# Patient Record
Sex: Male | Born: 1970 | Race: White | Hispanic: No | Marital: Married | Smoking: Current every day smoker
Health system: Southern US, Community
[De-identification: ages and names within clinical notes are randomized; demographics above are authoritative.]

## PROBLEM LIST (undated history)

## (undated) DIAGNOSIS — F32A Depression, unspecified: Secondary | ICD-10-CM

## (undated) DIAGNOSIS — K5792 Diverticulitis of intestine, part unspecified, without perforation or abscess without bleeding: Secondary | ICD-10-CM

## (undated) DIAGNOSIS — G43909 Migraine, unspecified, not intractable, without status migrainosus: Secondary | ICD-10-CM

## (undated) DIAGNOSIS — F329 Major depressive disorder, single episode, unspecified: Secondary | ICD-10-CM

## (undated) HISTORY — PX: NO PAST SURGERIES: SHX2092

---

## 2003-09-25 ENCOUNTER — Emergency Department (HOSPITAL_COMMUNITY): Admission: EM | Admit: 2003-09-25 | Discharge: 2003-09-25 | Payer: Self-pay | Admitting: Emergency Medicine

## 2004-07-12 ENCOUNTER — Emergency Department (HOSPITAL_COMMUNITY): Admission: EM | Admit: 2004-07-12 | Discharge: 2004-07-12 | Payer: Self-pay | Admitting: Emergency Medicine

## 2004-07-12 ENCOUNTER — Emergency Department (HOSPITAL_COMMUNITY): Admission: EM | Admit: 2004-07-12 | Discharge: 2004-07-12 | Payer: Self-pay | Admitting: Family Medicine

## 2004-10-24 ENCOUNTER — Emergency Department (HOSPITAL_COMMUNITY): Admission: EM | Admit: 2004-10-24 | Discharge: 2004-10-24 | Payer: Self-pay | Admitting: Emergency Medicine

## 2005-01-09 ENCOUNTER — Emergency Department (HOSPITAL_COMMUNITY): Admission: EM | Admit: 2005-01-09 | Discharge: 2005-01-09 | Payer: Self-pay | Admitting: Family Medicine

## 2005-01-09 ENCOUNTER — Emergency Department (HOSPITAL_COMMUNITY): Admission: EM | Admit: 2005-01-09 | Discharge: 2005-01-09 | Payer: Self-pay | Admitting: Emergency Medicine

## 2007-10-11 ENCOUNTER — Encounter: Admission: RE | Admit: 2007-10-11 | Discharge: 2007-10-11 | Payer: Self-pay | Admitting: Chiropractic Medicine

## 2009-08-24 ENCOUNTER — Ambulatory Visit: Payer: Self-pay | Admitting: Diagnostic Radiology

## 2009-08-24 ENCOUNTER — Emergency Department (HOSPITAL_BASED_OUTPATIENT_CLINIC_OR_DEPARTMENT_OTHER): Admission: EM | Admit: 2009-08-24 | Discharge: 2009-08-24 | Payer: Self-pay | Admitting: Emergency Medicine

## 2009-09-08 ENCOUNTER — Ambulatory Visit (HOSPITAL_COMMUNITY): Admission: RE | Admit: 2009-09-08 | Discharge: 2009-09-08 | Payer: Self-pay | Admitting: Plastic Surgery

## 2009-09-30 ENCOUNTER — Emergency Department (HOSPITAL_COMMUNITY): Admission: EM | Admit: 2009-09-30 | Discharge: 2009-09-30 | Payer: Self-pay | Admitting: Emergency Medicine

## 2009-10-06 ENCOUNTER — Ambulatory Visit (HOSPITAL_COMMUNITY): Admission: RE | Admit: 2009-10-06 | Discharge: 2009-10-06 | Payer: Self-pay | Admitting: Plastic Surgery

## 2009-11-05 ENCOUNTER — Ambulatory Visit (HOSPITAL_COMMUNITY): Admission: RE | Admit: 2009-11-05 | Discharge: 2009-11-05 | Payer: Self-pay | Admitting: Plastic Surgery

## 2011-01-20 ENCOUNTER — Inpatient Hospital Stay (INDEPENDENT_AMBULATORY_CARE_PROVIDER_SITE_OTHER)
Admission: RE | Admit: 2011-01-20 | Discharge: 2011-01-20 | Disposition: A | Discharge status: Home / Self Care | Payer: Self-pay | Source: Ambulatory Visit | Attending: Family Medicine | Admitting: Family Medicine

## 2011-01-20 DIAGNOSIS — K047 Periapical abscess without sinus: Secondary | ICD-10-CM

## 2011-07-23 ENCOUNTER — Encounter: Payer: Self-pay | Admitting: *Deleted

## 2011-07-23 ENCOUNTER — Emergency Department (HOSPITAL_COMMUNITY): Payer: Self-pay

## 2011-07-23 ENCOUNTER — Emergency Department (HOSPITAL_COMMUNITY)
Admission: EM | Admit: 2011-07-23 | Discharge: 2011-07-23 | Disposition: A | Discharge status: Home / Self Care | Payer: Self-pay | Attending: Emergency Medicine | Admitting: Emergency Medicine

## 2011-07-23 DIAGNOSIS — S6990XA Unspecified injury of unspecified wrist, hand and finger(s), initial encounter: Secondary | ICD-10-CM | POA: Insufficient documentation

## 2011-07-23 DIAGNOSIS — S66911A Strain of unspecified muscle, fascia and tendon at wrist and hand level, right hand, initial encounter: Secondary | ICD-10-CM

## 2011-07-23 DIAGNOSIS — M79609 Pain in unspecified limb: Secondary | ICD-10-CM | POA: Insufficient documentation

## 2011-07-23 DIAGNOSIS — Y9383 Activity, rough housing and horseplay: Secondary | ICD-10-CM | POA: Insufficient documentation

## 2011-07-23 DIAGNOSIS — X58XXXA Exposure to other specified factors, initial encounter: Secondary | ICD-10-CM | POA: Insufficient documentation

## 2011-07-23 DIAGNOSIS — M7989 Other specified soft tissue disorders: Secondary | ICD-10-CM | POA: Insufficient documentation

## 2011-07-23 DIAGNOSIS — S6390XA Sprain of unspecified part of unspecified wrist and hand, initial encounter: Secondary | ICD-10-CM | POA: Insufficient documentation

## 2011-07-23 MED ORDER — HYDROCODONE-ACETAMINOPHEN 5-325 MG PO TABS: 2.0000 | ORAL_TABLET | ORAL | Status: AC | PRN

## 2011-07-23 NOTE — Progress Notes (Signed)
Orthopedic Tech Progress Note Patient Details:  Darrell Hawkins 09-04-1970 [id number]  Splint Location: applied velcro wrist splint to right hand    [name] 07/23/2011, 12:55 PM

## 2011-07-23 NOTE — ED Provider Notes (Signed)
History     CSN: [id number]  Arrival date & time 07/23/11  1015   First MD Initiated Contact with Patient 07/23/11 1226      Chief Complaint  Patient presents with  . Hand Injury    (Consider location/radiation/quality/duration/timing/severity/associated sxs/prior treatment) HPI This 41 year old male was playing wrestling last night when he strained the dorsum of his right hand with mild swelling and tenderness since that time. His pain is moderately severe it is worse with palpation it is localized without radiation associated weakness or numbness or other concerns or symptoms. His pain is to the dorsum of the right hand it does not involve the right wrist. He has no pain to the elbow or shoulder. There is no treatment prior to arrival. He has no other concerns at this time. History reviewed. No pertinent past medical history.  History reviewed. No pertinent past surgical history.  History reviewed. No pertinent family history.  History  Substance Use Topics  . Smoking status: Current Everyday Smoker -- 1.0 packs/day  . Smokeless tobacco: Not on file  . Alcohol Use: Yes     occ      Review of Systems  Constitutional: Negative for fever.       10 Systems reviewed and are negative for acute change except as noted in the HPI.  HENT: Negative for congestion.   Eyes: Negative for discharge and redness.  Respiratory: Negative for cough and shortness of breath.   Cardiovascular: Negative for chest pain.  Gastrointestinal: Negative for vomiting and abdominal pain.  Musculoskeletal: Negative for back pain.  Skin: Negative for rash.  Neurological: Negative for syncope, numbness and headaches.  Psychiatric/Behavioral:       No behavior change.    Allergies  Review of patient's allergies indicates no known allergies.  Home Medications   Current Outpatient Rx  Name Route Sig Dispense Refill  . HYDROCODONE-ACETAMINOPHEN 5-325 MG PO TABS Oral Take 2 tablets by mouth every 4  (four) hours as needed for pain. 20 tablet 0    BP 130/72  Pulse 82  Temp(Src) 97.8 F (36.6 C) (Oral)  Resp 18  SpO2 99%  Physical Exam  Nursing note and vitals reviewed. Constitutional:       Awake, alert, nontoxic appearance.  HENT:  Head: Atraumatic.  Eyes: Right eye exhibits no discharge. Left eye exhibits no discharge.  Neck: Neck supple.  Pulmonary/Chest: Effort normal. He exhibits no tenderness.  Abdominal: Soft. There is no tenderness. There is no rebound.  Musculoskeletal: He exhibits tenderness.       Baseline ROM, no obvious new focal weakness.  His left arm and both legs are nontender. His right arm is nontender at the shoulder elbow and wrist including no tenderness to the anatomic snuffbox or lunate area. His right hand has full range of motion but he has painful extension at his wrist and fingers due to the dorsal hand mild swelling and moderate tenderness. He has capillary refill less than 2 seconds and normal light touch was intact extension and flexion of all digits on his right hand without obvious focal weakness.  Neurological: He is alert.       Mental status and motor strength appears baseline for patient and situation.  Skin: No rash noted.  Psychiatric: He has a normal mood and affect.    ED Course  Procedures (including critical care time)  Labs Reviewed - No data to display Dg Hand Complete Right  07/23/2011  *RADIOLOGY REPORT*  Clinical Data: Right hand  and wrist pain and swelling following a fall last night.  RIGHT HAND - COMPLETE 3+ VIEW  Comparison: None.  Findings: Normal appearing bones and soft tissues without fracture or dislocation.  IMPRESSION: Normal examination.  Original Report Authenticated By: Darrol Angel, M.D.     1. Strain of hand, right       MDM  Patient informed of clinical course, understand medical decision-making process, and agree with plan.        Hurman Horn, MD 07/23/11 [phone]

## 2011-07-23 NOTE — ED Notes (Signed)
Pt reports wrestling last night, landed on right hand and now having pain and swelling.

## 2012-05-21 ENCOUNTER — Encounter (HOSPITAL_COMMUNITY): Payer: Self-pay | Admitting: *Deleted

## 2012-05-21 ENCOUNTER — Emergency Department (HOSPITAL_COMMUNITY)
Admission: EM | Admit: 2012-05-21 | Discharge: 2012-05-21 | Disposition: A | Discharge status: Home / Self Care | Payer: Self-pay | Attending: Emergency Medicine | Admitting: Emergency Medicine

## 2012-05-21 ENCOUNTER — Emergency Department (HOSPITAL_COMMUNITY): Payer: Self-pay

## 2012-05-21 DIAGNOSIS — F17200 Nicotine dependence, unspecified, uncomplicated: Secondary | ICD-10-CM | POA: Insufficient documentation

## 2012-05-21 DIAGNOSIS — Y9389 Activity, other specified: Secondary | ICD-10-CM | POA: Insufficient documentation

## 2012-05-21 DIAGNOSIS — Y92009 Unspecified place in unspecified non-institutional (private) residence as the place of occurrence of the external cause: Secondary | ICD-10-CM | POA: Insufficient documentation

## 2012-05-21 DIAGNOSIS — S90129A Contusion of unspecified lesser toe(s) without damage to nail, initial encounter: Secondary | ICD-10-CM | POA: Insufficient documentation

## 2012-05-21 DIAGNOSIS — X58XXXA Exposure to other specified factors, initial encounter: Secondary | ICD-10-CM | POA: Insufficient documentation

## 2012-05-21 MED ORDER — HYDROCODONE-ACETAMINOPHEN 5-325 MG PO TABS
2.0000 | ORAL_TABLET | Freq: Once | ORAL | Status: AC
  Administered 2012-05-21: 2 via ORAL
  Filled 2012-05-21: qty 2

## 2012-05-21 MED ORDER — HYDROCODONE-ACETAMINOPHEN 5-500 MG PO TABS: 1.0000 | ORAL_TABLET | Freq: Four times a day (QID) | ORAL | Status: DC | PRN
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
## 2014-01-23 ENCOUNTER — Emergency Department (HOSPITAL_COMMUNITY)
Admission: EM | Admit: 2014-01-23 | Discharge: 2014-01-23 | Disposition: A | Discharge status: Home / Self Care | Payer: Self-pay | Attending: Emergency Medicine | Admitting: Emergency Medicine

## 2014-01-23 ENCOUNTER — Encounter (HOSPITAL_COMMUNITY): Payer: Self-pay | Admitting: Emergency Medicine

## 2014-01-23 DIAGNOSIS — F17200 Nicotine dependence, unspecified, uncomplicated: Secondary | ICD-10-CM | POA: Insufficient documentation

## 2014-01-23 DIAGNOSIS — Y9289 Other specified places as the place of occurrence of the external cause: Secondary | ICD-10-CM | POA: Insufficient documentation

## 2014-01-23 DIAGNOSIS — H16133 Photokeratitis, bilateral: Secondary | ICD-10-CM

## 2014-01-23 DIAGNOSIS — H16139 Photokeratitis, unspecified eye: Secondary | ICD-10-CM | POA: Insufficient documentation

## 2014-01-23 DIAGNOSIS — Z79899 Other long term (current) drug therapy: Secondary | ICD-10-CM | POA: Insufficient documentation

## 2014-01-23 DIAGNOSIS — Y9389 Activity, other specified: Secondary | ICD-10-CM | POA: Insufficient documentation

## 2014-01-23 DIAGNOSIS — W899XXA Exposure to unspecified man-made visible and ultraviolet light, initial encounter: Secondary | ICD-10-CM | POA: Insufficient documentation

## 2014-01-23 MED ORDER — FLUORESCEIN SODIUM 1 MG OP STRP
1.0000 | ORAL_STRIP | Freq: Once | OPHTHALMIC | Status: AC
  Administered 2014-01-23: 1 via OPHTHALMIC
  Filled 2014-01-23 (×2): qty 1

## 2014-01-23 MED ORDER — OXYCODONE-ACETAMINOPHEN 5-325 MG PO TABS: 1.0000 | ORAL_TABLET | Freq: Four times a day (QID) | ORAL | Status: DC | PRN

## 2014-01-23 MED ORDER — TETRACAINE HCL 0.5 % OP SOLN
2.0000 [drp] | Freq: Once | OPHTHALMIC | Status: AC
  Administered 2014-01-23: 2 [drp] via OPHTHALMIC
  Filled 2014-01-23: qty 2

## 2014-01-23 MED ORDER — ERYTHROMYCIN 5 MG/GM OP OINT: 1.0000 "application " | TOPICAL_OINTMENT | Freq: Four times a day (QID) | OPHTHALMIC | Status: DC
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
## 2014-01-23 NOTE — ED Notes (Signed)
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
## 2014-01-25 NOTE — ED Provider Notes (Signed)
Medical screening examination/treatment/procedure(s) were performed by non-physician practitioner and as supervising physician I was immediately available for consultation/collaboration.   EKG Interpretation None        [name] M Kainen Struckman, DO 01/25/14 1411 

## 2014-02-13 ENCOUNTER — Emergency Department (INDEPENDENT_AMBULATORY_CARE_PROVIDER_SITE_OTHER): Payer: Self-pay

## 2014-02-13 ENCOUNTER — Encounter (HOSPITAL_COMMUNITY): Payer: Self-pay | Admitting: Emergency Medicine

## 2014-02-13 ENCOUNTER — Emergency Department (INDEPENDENT_AMBULATORY_CARE_PROVIDER_SITE_OTHER)
Admission: EM | Admit: 2014-02-13 | Discharge: 2014-02-13 | Disposition: A | Discharge status: Home / Self Care | Payer: Self-pay | Source: Home / Self Care | Attending: Family Medicine | Admitting: Family Medicine

## 2014-02-13 DIAGNOSIS — J40 Bronchitis, not specified as acute or chronic: Secondary | ICD-10-CM

## 2014-02-13 MED ORDER — ALBUTEROL SULFATE HFA 108 (90 BASE) MCG/ACT IN AERS: 2.0000 | INHALATION_SPRAY | RESPIRATORY_TRACT | Status: DC | PRN

## 2014-02-13 MED ORDER — AZITHROMYCIN 250 MG PO TABS: ORAL_TABLET | ORAL | Status: DC

## 2014-02-13 MED ORDER — PREDNISONE 50 MG PO TABS: 50.0000 mg | ORAL_TABLET | Freq: Every day | ORAL | Status: DC

## 2014-02-13 NOTE — ED Notes (Signed)
Pt c/o cold sx since yest Sx include: productive cough, congestion, runny nose, HA, f/v Smokes 1 PPD Alert and talking in complete sentences w/no signs of acute distress.

## 2014-02-13 NOTE — ED Provider Notes (Signed)
CSN: [id number]     Arrival date & time 02/13/14  0908 History   First MD Initiated Contact with Patient 02/13/14 [phone]     Chief Complaint  Patient presents with  . URI   (Consider location/radiation/quality/duration/timing/severity/associated sxs/prior Treatment) HPI Comments: 43 year old male presents complaining of being sick since yesterday. He has productive cough with mild shortness of breath, nasal congestion, runny nose, headache, subjective fever and chills. His symptoms have been progressively worsening which worried him, this seems to be much worse than a regular cold to him. Over-the-counter medications are not helping significantly. No recent travel or sick contacts. He does smoke one pack per day and has done so for many years. No previous diagnosis of COPD.   History reviewed. No pertinent past medical history. History reviewed. No pertinent past surgical history. No family history on file. History  Substance Use Topics  . Smoking status: Current Every Day Smoker -- 1.00 packs/day  . Smokeless tobacco: Not on file  . Alcohol Use: Yes     Comment: occ    Review of Systems  HENT: Positive for congestion, rhinorrhea and sore throat. Negative for ear pain, postnasal drip and sinus pressure.   Respiratory: Positive for cough, chest tightness and shortness of breath.   Cardiovascular: Negative for chest pain.  Gastrointestinal: Negative for nausea, vomiting and diarrhea.  All other systems reviewed and are negative.   Allergies  Review of patient's allergies indicates no known allergies.  Home Medications   Prior to Admission medications   Medication Sig Start Date End Date Taking? Authorizing Provider  albuterol (PROVENTIL HFA;VENTOLIN HFA) 108 (90 BASE) MCG/ACT inhaler Inhale 2 puffs into the lungs every 4 (four) hours as needed for wheezing. 02/13/14   Graylon Good, PA-C  azithromycin (ZITHROMAX Z-PAK) 250 MG tablet Use as directed 02/13/14   Graylon Good, PA-C   erythromycin ophthalmic ointment Place 1 application into both eyes 4 (four) times daily. Apply thin 1/2 inch ribbon to lower eyelid every 6 hours x 7 days 01/23/14   [name] Camprubi-Soms, PA-C  oxyCODONE-acetaminophen (PERCOCET) 5-325 MG per tablet Take 1-2 tablets by mouth every 6 (six) hours as needed for severe pain. 01/23/14   [name] Camprubi-Soms, PA-C  predniSONE (DELTASONE) 50 MG tablet Take 1 tablet (50 mg total) by mouth daily with breakfast. 02/13/14   Graylon Good, PA-C   BP 124/92  Pulse 88  Temp(Src) 98.4 F (36.9 C) (Oral)  Resp 12  SpO2 100% Physical Exam  Nursing note and vitals reviewed. Constitutional: He is oriented to person, place, and time. He appears well-developed and well-nourished. No distress.  HENT:  Head: Normocephalic and atraumatic.  Right Ear: External ear normal.  Left Ear: External ear normal.  Nose: Nose normal.  Mouth/Throat: Oropharynx is clear and moist. No oropharyngeal exudate.  Eyes: Conjunctivae are normal. Right eye exhibits no discharge. Left eye exhibits no discharge.  Cardiovascular: Normal rate, regular rhythm and normal heart sounds.   Pulmonary/Chest: Effort normal and breath sounds normal. No respiratory distress. He has no wheezes. He has no rales.  Abdominal: Soft. He exhibits no mass. There is no tenderness. There is no rebound and no guarding.  Neurological: He is alert and oriented to person, place, and time. Coordination normal.  Skin: Skin is warm and dry. No rash noted. He is not diaphoretic.  Psychiatric: He has a normal mood and affect. Judgment normal.    ED Course  Procedures (including critical care time) Labs Review Labs Reviewed -  No data to display  Imaging Review Dg Chest 2 View  02/13/2014   CLINICAL DATA:  Cough and chest congestion with low-grade fever; history of tobacco use and bronchitis  EXAM: CHEST  2 VIEW  COMPARISON:  PA and lateral chest x-ray of September 25, 2003  FINDINGS: The lungs are  mildly hyperinflated. There is no focal infiltrate. There are no abnormal pulmonary nodules. The heart and mediastinal structures are normal. There is no pleural effusion, pneumothorax, or pneumomediastinum. The bony thorax is unremarkable.  IMPRESSION: Mild hyperinflation is consistent with COPD. There is no pneumonia nor other acute cardiopulmonary abnormality.   Electronically Signed   By: David  [place]azil[place]   On: 02/13/2014 10:13     MDM   1. Bronchitis    Acute bronchitis, treat as COPD exacerbation with azithromycin, prednisone, and albuterol inhaler. Advised to quit smoking. Followup when necessary   Meds ordered this encounter  Medications  . predniSONE (DELTASONE) 50 MG tablet    Sig: Take 1 tablet (50 mg total) by mouth daily with breakfast.    Dispense:  5 tablet    Refill:  0    Order Specific Question:  Supervising Provider    Answer:  Clementeen GrahamOREY, EVAN, S [[id number]]  . albuterol (PROVENTIL HFA;VENTOLIN HFA) 108 (90 BASE) MCG/ACT inhaler    Sig: Inhale 2 puffs into the lungs every 4 (four) hours as needed for wheezing.    Dispense:  1 Inhaler    Refill:  0    Order Specific Question:  Supervising Provider    Answer:  Clementeen GrahamOREY, EVAN, S [id number]]  . azithromycin (ZITHROMAX Z-PAK) 250 MG tablet    Sig: Use as directed    Dispense:  6 each    Refill:  0    Order Specific Question:  Supervising Provider    Answer:  Clementeen GrahamOREY, EVAN, Kathie RhodesS [[id number]]       Graylon GoodZachary H Ileen Kahre, PA-C 02/13/14 2147

## 2014-02-14 NOTE — ED Provider Notes (Signed)
Medical screening examination/treatment/procedure(s) were performed by a resident physician or non-physician practitioner and as the supervising physician I was immediately available for consultation/collaboration.  Clementeen GrahamEvan Kylle Lall, MD    Rodolph BongEvan S Nioma Mccubbins, MD 02/14/14 [phone]

## 2015-05-19 ENCOUNTER — Encounter (HOSPITAL_COMMUNITY): Payer: Self-pay | Admitting: Emergency Medicine

## 2015-05-19 ENCOUNTER — Encounter (HOSPITAL_COMMUNITY): Payer: Self-pay

## 2015-05-19 ENCOUNTER — Inpatient Hospital Stay (HOSPITAL_COMMUNITY)
Admission: EM | Admit: 2015-05-19 | Discharge: 2015-05-20 | DRG: 885 | Disposition: A | Discharge status: Home / Self Care | Payer: Federal, State, Local not specified - Other | Source: Intra-hospital | Attending: Psychiatry | Admitting: Psychiatry

## 2015-05-19 ENCOUNTER — Emergency Department (HOSPITAL_COMMUNITY)
Admission: EM | Admit: 2015-05-19 | Discharge: 2015-05-19 | Disposition: A | Discharge status: Other Acute Inpatient Hospital | Payer: Self-pay | Attending: Emergency Medicine | Admitting: Emergency Medicine

## 2015-05-19 DIAGNOSIS — F410 Panic disorder [episodic paroxysmal anxiety] without agoraphobia: Secondary | ICD-10-CM | POA: Diagnosis present

## 2015-05-19 DIAGNOSIS — Z046 Encounter for general psychiatric examination, requested by authority: Secondary | ICD-10-CM | POA: Insufficient documentation

## 2015-05-19 DIAGNOSIS — F1024 Alcohol dependence with alcohol-induced mood disorder: Secondary | ICD-10-CM | POA: Insufficient documentation

## 2015-05-19 DIAGNOSIS — F1029 Alcohol dependence with unspecified alcohol-induced disorder: Secondary | ICD-10-CM

## 2015-05-19 DIAGNOSIS — G4700 Insomnia, unspecified: Secondary | ICD-10-CM | POA: Diagnosis present

## 2015-05-19 DIAGNOSIS — F17200 Nicotine dependence, unspecified, uncomplicated: Secondary | ICD-10-CM | POA: Diagnosis present

## 2015-05-19 DIAGNOSIS — Z79899 Other long term (current) drug therapy: Secondary | ICD-10-CM | POA: Insufficient documentation

## 2015-05-19 DIAGNOSIS — F10239 Alcohol dependence with withdrawal, unspecified: Secondary | ICD-10-CM | POA: Diagnosis not present

## 2015-05-19 DIAGNOSIS — F332 Major depressive disorder, recurrent severe without psychotic features: Secondary | ICD-10-CM

## 2015-05-19 DIAGNOSIS — F1094 Alcohol use, unspecified with alcohol-induced mood disorder: Secondary | ICD-10-CM | POA: Diagnosis present

## 2015-05-19 DIAGNOSIS — Y903 Blood alcohol level of 60-79 mg/100 ml: Secondary | ICD-10-CM | POA: Diagnosis present

## 2015-05-19 DIAGNOSIS — F10230 Alcohol dependence with withdrawal, uncomplicated: Secondary | ICD-10-CM | POA: Diagnosis present

## 2015-05-19 DIAGNOSIS — Z720 Tobacco use: Secondary | ICD-10-CM | POA: Insufficient documentation

## 2015-05-19 DIAGNOSIS — F339 Major depressive disorder, recurrent, unspecified: Principal | ICD-10-CM | POA: Diagnosis present

## 2015-05-19 DIAGNOSIS — Z590 Homelessness: Secondary | ICD-10-CM

## 2015-05-19 DIAGNOSIS — R45851 Suicidal ideations: Secondary | ICD-10-CM

## 2015-05-19 DIAGNOSIS — Z8659 Personal history of other mental and behavioral disorders: Secondary | ICD-10-CM | POA: Insufficient documentation

## 2015-05-19 HISTORY — DX: Major depressive disorder, single episode, unspecified: F32.9

## 2015-05-19 HISTORY — DX: Depression, unspecified: F32.A

## 2015-05-19 LAB — CBC
HEMATOCRIT: 44.6 % (ref 39.0–52.0)
Hemoglobin: 15.1 g/dL (ref 13.0–17.0)
MCH: 30 pg (ref 26.0–34.0)
MCHC: 33.9 g/dL (ref 30.0–36.0)
MCV: 88.7 fL (ref 78.0–100.0)
PLATELETS: 214 10*3/uL (ref 150–400)
RBC: 5.03 MIL/uL (ref 4.22–5.81)
RDW: 13.5 % (ref 11.5–15.5)
WBC: 6.9 10*3/uL (ref 4.0–10.5)

## 2015-05-19 LAB — COMPREHENSIVE METABOLIC PANEL
ALK PHOS: 58 U/L (ref 38–126)
ALT: 12 U/L — AB (ref 17–63)
AST: 17 U/L (ref 15–41)
Albumin: 3.7 g/dL (ref 3.5–5.0)
Anion gap: 8 (ref 5–15)
BILIRUBIN TOTAL: 0.7 mg/dL (ref 0.3–1.2)
BUN: 8 mg/dL (ref 6–20)
CO2: 24 mmol/L (ref 22–32)
CREATININE: 0.83 mg/dL (ref 0.61–1.24)
Calcium: 8.4 mg/dL — ABNORMAL LOW (ref 8.9–10.3)
Chloride: 107 mmol/L (ref 101–111)
GFR calc Af Amer: >60 mL/min (ref >60)
Glucose, Bld: 99 mg/dL (ref 65–99)
Potassium: 3.9 mmol/L (ref 3.5–5.1)
Sodium: 139 mmol/L (ref 135–145)
TOTAL PROTEIN: 6.3 g/dL — AB (ref 6.5–8.1)

## 2015-05-19 LAB — RAPID URINE DRUG SCREEN, HOSP PERFORMED
AMPHETAMINES: NOT DETECTED
BARBITURATES: NOT DETECTED
Benzodiazepines: NOT DETECTED
Cocaine: NOT DETECTED
Opiates: NOT DETECTED
Tetrahydrocannabinol: NOT DETECTED

## 2015-05-19 LAB — ETHANOL: ALCOHOL ETHYL (B): 79 mg/dL — AB (ref <5)

## 2015-05-19 LAB — SALICYLATE LEVEL: Salicylate Lvl: <4 mg/dL (ref 2.8–30.0)

## 2015-05-19 LAB — ACETAMINOPHEN LEVEL: Acetaminophen (Tylenol), Serum: <10 ug/mL — ABNORMAL LOW (ref 10–30)

## 2015-05-19 MED ORDER — LORAZEPAM 1 MG PO TABS: 1.0000 mg | ORAL_TABLET | Freq: Every day | ORAL | Status: DC

## 2015-05-19 MED ORDER — HYDROXYZINE HCL 25 MG PO TABS: 25.0000 mg | ORAL_TABLET | Freq: Four times a day (QID) | ORAL | Status: DC | PRN

## 2015-05-19 MED ORDER — LOPERAMIDE HCL 2 MG PO CAPS: 2.0000 mg | ORAL_CAPSULE | ORAL | Status: DC | PRN

## 2015-05-19 MED ORDER — ACETAMINOPHEN 325 MG PO TABS: 650.0000 mg | ORAL_TABLET | Freq: Four times a day (QID) | ORAL | Status: DC | PRN

## 2015-05-19 MED ORDER — IBUPROFEN 200 MG PO TABS: 600.0000 mg | ORAL_TABLET | Freq: Three times a day (TID) | ORAL | Status: DC | PRN

## 2015-05-19 MED ORDER — LORAZEPAM 1 MG PO TABS: 1.0000 mg | ORAL_TABLET | Freq: Four times a day (QID) | ORAL | Status: DC | PRN

## 2015-05-19 MED ORDER — ONDANSETRON 4 MG PO TBDP: 4.0000 mg | ORAL_TABLET | Freq: Four times a day (QID) | ORAL | Status: DC | PRN

## 2015-05-19 MED ORDER — ALUM & MAG HYDROXIDE-SIMETH 200-200-20 MG/5ML PO SUSP: 30.0000 mL | ORAL | Status: DC | PRN

## 2015-05-19 MED ORDER — THIAMINE HCL 100 MG/ML IJ SOLN
100.0000 mg | Freq: Once | INTRAMUSCULAR | Status: AC
  Administered 2015-05-19: 100 mg via INTRAMUSCULAR
  Filled 2015-05-19: qty 2

## 2015-05-19 MED ORDER — MAGNESIUM HYDROXIDE 400 MG/5ML PO SUSP: 30.0000 mL | Freq: Every day | ORAL | Status: DC | PRN

## 2015-05-19 MED ORDER — LORAZEPAM 1 MG PO TABS: 1.0000 mg | ORAL_TABLET | Freq: Two times a day (BID) | ORAL | Status: DC

## 2015-05-19 MED ORDER — LORAZEPAM 1 MG PO TABS
1.0000 mg | ORAL_TABLET | Freq: Three times a day (TID) | ORAL | Status: DC
  Administered 2015-05-20: 1 mg via ORAL
  Filled 2015-05-19: qty 1

## 2015-05-19 MED ORDER — ACETAMINOPHEN 325 MG PO TABS: 650.0000 mg | ORAL_TABLET | ORAL | Status: DC | PRN

## 2015-05-19 MED ORDER — ADULT MULTIVITAMIN W/MINERALS CH
1.0000 | ORAL_TABLET | Freq: Every day | ORAL | Status: DC
  Administered 2015-05-19 – 2015-05-20 (×2): 1 via ORAL
  Filled 2015-05-19 (×5): qty 1

## 2015-05-19 MED ORDER — LORAZEPAM 1 MG PO TABS
1.0000 mg | ORAL_TABLET | Freq: Four times a day (QID) | ORAL | Status: AC
  Administered 2015-05-19 – 2015-05-20 (×4): 1 mg via ORAL
  Filled 2015-05-19 (×4): qty 1

## 2015-05-19 MED ORDER — VITAMIN B-1 100 MG PO TABS
100.0000 mg | ORAL_TABLET | Freq: Every day | ORAL | Status: DC
  Administered 2015-05-20: 100 mg via ORAL
  Filled 2015-05-19 (×3): qty 1

## 2015-05-19 MED ORDER — TRAZODONE HCL 100 MG PO TABS
100.0000 mg | ORAL_TABLET | Freq: Every evening | ORAL | Status: DC | PRN
  Administered 2015-05-19: 100 mg via ORAL
  Filled 2015-05-19: qty 7
  Filled 2015-05-19: qty 1
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
## 2015-05-19 NOTE — [facility] Group Notes (Addendum)
[facility] LCSW Group Therapy  05/19/2015   1:15 PM   Type of Therapy:  Group Therapy  Participation Level:  Active  Participation Quality:  Attentive, Sharing and Supportive  Affect:  Depressed and Flat  Cognitive:  Alert and Oriented  Insight:  Developing/Improving and Engaged  Engagement in Therapy:  Developing/Improving and Engaged  Modes of Intervention:  Clarification, Confrontation, Discussion, Education, Exploration, Limit-setting, Orientation, Problem-solving, Rapport Building, Dance movement psychotherapisteality Testing, Socialization and Support  Summary of Progress/Problems: The topic for group therapy was feelings about diagnosis.  Pt actively participated in group discussion on their past and current diagnosis and how they feel towards this.  Pt also identified how society and family members judge them, based on their diagnosis as well as stereotypes and stigmas.  Patient did not engage in discussion despite CSW encouragement but was observed actively listening.  Samuella BruinKristin Shadrach Bartunek, MSW, [employer]CSWA Clinical Social Worker [facility] [phone]

## 2015-05-19 NOTE — Progress Notes (Signed)
This a 44 yr old male who went Ed after the girl friend called police on him because the pt had razor blade threatening to kill himself. [facility] because of increase in SI.During admission, pt stated that his girlfriend threw him out after an argument and now he has nowhere to go. Pt also stated that he had a court day today and was not sure what is going to happen.  Pt looked worried during admission and sad but was cooperative. Pt denied SI, HI during admission and contracted for safety. Pt was shown his room and introduced to the unit. Pt is on Q 15 min checks for safety. We will continue to monitor.

## 2015-05-19 NOTE — ED Notes (Signed)
Patient is a 44 year old male who came to the ED voluntarily after getting in an argument with his girlfriend and threatening to cut his throat with a razor. Patient has a long history of alcohol abuse stating that he drinks every day and about 6 to 8 beers each weekend and endorses a suicide attempt in 2013 where he attempted to jump out of a moving vehicle. Patient reports that he has drank even more heavily in the past week. Patient used to receive [facility] and was prescribed Trazodone and Abilify which he stopped taking 2 years ago because he didn't feel that they were effective and he could no longer afford them.  Patient currently denies SI/HI/AVH and pain and presents depressed and anxious and says that he is "stressed out". Patient has a final court date this morning at 8:30 AM and is anxious since he will not be able to attend. Every 15 minute checks completed for safety.

## 2015-05-19 NOTE — Progress Notes (Signed)
[facility] Group Notes:  (Nursing/MHT/Case Management/Adjunct)  Date:  05/19/2015  Time:  9:16 PM  Type of Therapy:  Psychoeducational Skills  Participation Level:  Active  Participation Quality:  Appropriate, Attentive, Sharing and Supportive  Affect:  Appropriate  Cognitive:  Appropriate  Insight:  Appropriate  Engagement in Group:  Engaged  Modes of Intervention:  Discussion  Summary of Progress/Problems: Pt attended group this evening and was appropriate. Pt rated his day a 4/10. Pt stated he woke up today, which was a good thing. Pt stated his goal is to get his head on straight which he accomplished and is still working on. Pt stated one positive thing is that he got to eat some good food. Caswell CorwinOwen, Mykeisha Dysert C 05/19/2015, 9:16 PM

## 2015-05-19 NOTE — ED Notes (Signed)
PA in to see pt. 

## 2015-05-19 NOTE — [facility] Suicide Risk Assessment (Signed)
[facility]   Nursing information obtained from:    Demographic factors:    Current Mental Status:    Loss Factors:    Historical Factors:    Risk Reduction Factors:    Total Time spent with patient: 45 minutes Principal Problem: Recurrent major depression (HCC) Diagnosis:   Patient Active Problem List   Diagnosis Date Noted  . Alcohol dependence with uncomplicated withdrawal (HCC) [F10.230] 05/19/2015  . Alcohol-induced mood disorder (HCC) [F10.94] 05/19/2015  . Recurrent major depression (HCC) [F33.9] 05/19/2015     Continued Clinical Symptoms:  Alcohol Use Disorder Identification Test Final Score (AUDIT): 8 The "Alcohol Use Disorders Identification Test", Guidelines for Use in Primary Care, Second Edition.  World Science writerHealth Organization [facility](WHO). Score between 0-7:  no or low risk or alcohol related problems. Score between 8-15:  moderate risk of alcohol related problems. Score between 16-19:  high risk of alcohol related problems. Score 20 or above:  warrants further diagnostic evaluation for alcohol dependence and treatment.   CLINICAL FACTORS:   Depression:   Comorbid alcohol abuse/dependence Alcohol/Substance Abuse/Dependencies  Psychiatric Specialty Exam: Physical Exam  ROS  Blood pressure 110/72, pulse 85, temperature 98.5 F (36.9 C), temperature source Oral, resp. rate 18, height 5\' 8"  (1.727 m), weight 73.029 kg (161 lb), SpO2 98 %.Body mass index is 24.49 kg/(m^2).    COGNITIVE FEATURES THAT CONTRIBUTE TO RISK:  Closed-mindedness, Polarized thinking and Thought constriction (tunnel vision)    SUICIDE RISK:   Moderate:  Frequent suicidal ideation with limited intensity, and duration, some specificity in terms of plans, no associated intent, good self-control, limited dysphoria/symptomatology, some risk factors present, and identifiable protective factors, including available and accessible social support.  PLAN OF CARE: See Admission H and  P  Medical Decision Making:  Review of Psycho-Social Stressors (1), Review or order clinical lab tests (1), Review of Medication Regimen & Side Effects (2) and Review of New Medication or Change in Dosage (2)  I certify that inpatient services furnished can reasonably be expected to improve the patient's condition.   Alsie Younes A 05/19/2015, 3:09 PM

## 2015-05-19 NOTE — ED Provider Notes (Signed)
CSN: [id number]     Arrival date & time 05/19/15  0303 History   First MD Initiated Contact with Patient 05/19/15 0408     Chief Complaint  Patient presents with  . Suicidal     (Consider location/radiation/quality/duration/timing/severity/associated sxs/prior Treatment) HPI Comments: Patient is a 44 year old male who presents with suicidal ideations that started this evening after an argument with his girlfriend. Patient reports he got a razor blade and threatened to hurt himself, which prompted his girlfriend to call the police. Patient denies HI. He reports a history of a suicide attempt in 2013 when he jumped out of a moving car. He reports having 2 beers and 1 glass of wine tonight. He denies illicit drug use.    Past Medical History  Diagnosis Date  . Depression    History reviewed. No pertinent past surgical history. History reviewed. No pertinent family history. Social History  Substance Use Topics  . Smoking status: Current Every Day Smoker -- 1.00 packs/day  . Smokeless tobacco: None  . Alcohol Use: Yes     Comment: occ    Review of Systems  Psychiatric/Behavioral: Positive for suicidal ideas.  All other systems reviewed and are negative.     Allergies  Review of patient's allergies indicates no known allergies.  Home Medications   Prior to Admission medications   Medication Sig Start Date End Date Taking? Authorizing Provider  albuterol (PROVENTIL HFA;VENTOLIN HFA) 108 (90 BASE) MCG/ACT inhaler Inhale 2 puffs into the lungs every 4 (four) hours as needed for wheezing. Patient not taking: Reported on 05/19/2015 02/13/14   Graylon Good, PA-C   BP 103/70 mmHg  Pulse 96  Temp(Src) 98 F (36.7 C) (Oral)  Resp 14  Ht  (1.727 m)  Wt 161 lb (73.029 kg)  BMI 24.49 kg/m2  SpO2 96% Physical Exam  Constitutional: He appears well-developed and well-nourished. No distress.  HENT:  Head: Normocephalic and atraumatic.  Eyes: Conjunctivae and EOM are normal.   Neck: Normal range of motion.  Cardiovascular: Normal rate and regular rhythm.  Exam reveals no gallop and no friction rub.   No murmur heard. Pulmonary/Chest: Effort normal and breath sounds normal. He has no wheezes. He has no rales. He exhibits no tenderness.  Abdominal: Soft. He exhibits no distension. There is no tenderness. There is no rebound.  Musculoskeletal: Normal range of motion.  Neurological: He is alert. Coordination normal.  Speech is goal-oriented. Moves limbs without ataxia.   Skin: Skin is warm and dry.  Psychiatric: His behavior is normal.  Flat affect  Nursing note and vitals reviewed.   ED Course  Procedures (including critical care time) Labs Review Labs Reviewed  COMPREHENSIVE METABOLIC PANEL - Abnormal; Notable for the following:    Calcium 8.4 (*)    Total Protein 6.3 (*)    ALT 12 (*)    All other components within normal limits  ETHANOL - Abnormal; Notable for the following:    Alcohol, Ethyl (B) 79 (*)    All other components within normal limits  ACETAMINOPHEN LEVEL - Abnormal; Notable for the following:    Acetaminophen (Tylenol), Serum <10 (*)    All other components within normal limits  SALICYLATE LEVEL  CBC  URINE RAPID DRUG SCREEN, HOSP PERFORMED    Imaging Review No results found. I have personally reviewed and evaluated these images and lab results as part of my medical decision-making.   EKG Interpretation None      MDM   Final  diagnoses:  Suicidal ideation    4:32 AM Patient labs pending.      Emilia BeckKaitlyn Jamael Hoffmann, PA-C 05/19/15 [id number]  Lyndal Pulleyaniel Knott, MD 05/19/15 1016

## 2015-05-19 NOTE — ED Provider Notes (Signed)
Accepted for transfer to [facility].   Vitals are normal.  Labs reviewed.  No acute medical issues.  Stable for transfer.  Linwood DibblesJon Schylar Allard, MD 05/19/15 [phone]

## 2015-05-19 NOTE — Progress Notes (Signed)
D:Patient in his room on approach.  Patient denies depression and states anxiety is 8/10.  Patient states his goal for today was to, "change my mindset, stop letting things worry me."  Patient states he was able to meet his goal for today Patient denies SI/HI and denies AVH.   A: Staff to monitor Q 15 mins for safety.  Encouragement and support offered.  Scheduled medications administered per orders.  Trazodone administered prn for sleep. R: Patient remains safe on the unit.  Patient attended group tonight.  Patient visible on the unit and interacting with peers.  Patient taking administered medications.

## 2015-05-19 NOTE — ED Notes (Signed)
Marcus with TTS in with pt

## 2015-05-19 NOTE — ED Notes (Signed)
Pt brought in by police voluntarily for suicidal ideations  Pt states he had an argument with his girlfriend and he had a razor blade and threatened to kill himself  Girlfriend called the police  Pt states he has hx of depression and has been to [facility] before  Pt states he is not on any medication at this time  Pt states he has been drinking tonight and drinks on the regular basis

## 2015-05-19 NOTE — ED Notes (Signed)
Patient accepted to [facility].  Report called to Zada FindersJane O.  Patient to be transported via [facility].  Patient depressed with passive SI.  Currently eating breakfast awaiting transportation.

## 2015-05-19 NOTE — [facility] Suicide Risk Assessment (Signed)
[facility] INPATIENT:  Family/Significant Other Suicide Prevention Education  Suicide Prevention Education:  Patient Refusal for Family/Significant Other Suicide Prevention Education: The patient Darrell Hawkins has refused to provide written consent for family/significant other to be provided Family/Significant Other Suicide Prevention Education during admission and/or prior to discharge.  Physician notified. SPE reviewed with patient and brochure provided. Patient encouraged to return to hospital if having suicidal thoughts, patient verbalized his/her understanding and has no further questions at this time.   Valton Schwartz, West CarboKristin L 05/19/2015, 3:29 PM

## 2015-05-19 NOTE — Progress Notes (Deleted)
Recreation Therapy Notes  Animal-Assisted Activity (AAA) Program Checklist/Progress Notes Patient Eligibility Criteria Checklist & Daily Group note for Rec Tx Intervention  Date: 11.01.2016 Time: 2:15pm [facility] [address] [facility] [facility]   AAA/T Program Assumption of Risk Form signed by Patient/ or Parent Legal Guardian NO  Behavioral Response: Did not attend.   Marykay Lexenise L Daemien Fronczak, LRT/CTRS  Jearl KlinefelterBlanchfield, Paige Monarrez L 05/19/2015 2:31 PM

## 2015-05-19 NOTE — BH Assessment (Addendum)
Tele Assessment Note   Darrell Hawkins is an 44 y.o. male.  -Clinician reviewed note by PA Emilia Beck.  Patient's gf had called the police because patient had gotten a razor blade and threatened to kill himself with it.  Patient admits to having a suicide attempt in 2013.  Patient has been drinking tonight.  Pt admits to feeling like killing himself tonight.  He has significant stressors in his life.  He has financial problems, mother died about a year ago; relationship problems with gf and a court date for DUI and driving while license revoked.  Court date is today (11/01).  Patient said that he is essentially homeless.  When he and gf are on the outs he is homeless.  Patient has a previous suicide attempt in 2013.  Patient had jumped from a moving vehicle.  He went to [facility].  Patient had followed up with [facility] for awhile but has not been on medication for over two years.  Patient says that he drinks up to a 6 pack on the weekends and a glass of wine with a meal.  Patient says he used to "drink like a fish.'  He says he has had some increase in drinking over the last two weeks.  Patient says he uses marijuana 1-2 times in a month.  Patient is tearful at times.  He looks down a lot and talks about how bad his life has been.  Patient was adopted by grandparents after birthmother gave him up.  He has been in touch with her, this is the one that died this past year.  Father works across the street from him but does not initiate contact, pt has to go and see him occasionally.  -Clinician discussed patient care with Donell Sievert, PA who recommends inpatient care.  Patient has been accepted to [facility] to the services of Dr. Dub Mikes.  Daytime AC will make arrangements for time of transfer.  Diagnosis:  Axis 1: MDD recurrent severe; ETOH use d/o moderate Axis 2: Deferred Axis 3: See H & P Axis 4: problems with housing, problems with access to healthcare, problems with psychosocial  relations, problems with legal issues Axis 5: GAF 32  Past Medical History:  Past Medical History  Diagnosis Date  . Depression     History reviewed. No pertinent past surgical history.  Family History: History reviewed. No pertinent family history.  Social History:  reports that he has been smoking.  He does not have any smokeless tobacco history on file. He reports that he drinks alcohol. He reports that he uses illicit drugs (Marijuana).  Additional Social History:  Alcohol / Drug Use Pain Medications: None Prescriptions: None Over the Counter: None History of alcohol / drug use?: Yes Substance #1 Name of Substance 1: ETOH 1 - Age of First Use: 44 years of age 43 - Amount (size/oz): Varies.  May drink up to a 6 pack on the weekend.  May drink a glass of wine with a meal 1 - Frequency: 2-3 times in a week 1 - Duration: Last two weeks at that rate 1 - Last Use / Amount: 10/31 Three beers and a glass of wine. Substance #2 Name of Substance 2: Marijuana 2 - Age of First Use: 44 years old 2 - Amount (size/oz): Two joints in a  month 2 - Frequency: About once per month 2 - Duration: On-gong 2 - Last Use / Amount: Over a month  CIWA: CIWA-Ar BP: 103/70 mmHg Pulse Rate:  96 COWS:    PATIENT STRENGTHS: (choose at least two) Average or above average intelligence Capable of independent living Communication skills Motivation for treatment/growth  Allergies: No Known Allergies  Home Medications:  (Not in a hospital admission)  OB/GYN Status:  No LMP for male patient.  General Assessment Data Location of Assessment: [facility] TTS Assessment: In system Is this a Tele or Face-to-Face Assessment?: Face-to-Face Is this an Initial Assessment or a Re-assessment for this encounter?: Initial Assessment Marital status: Single Is patient pregnant?: No Pregnancy Status: No Living Arrangements: Spouse/significant other, Other (Comment) (Sometimes is homeless) Can pt return to current  living arrangement?: Yes Admission Status: Voluntary Is patient capable of signing voluntary admission?: Yes Referral Source: Self/Family/Friend (Pt's gf had called the police.) Insurance type: SP     Crisis Care Plan Living Arrangements: Spouse/significant other, Other (Comment) (Sometimes is homeless) Name of Psychiatrist: None Name of Therapist: None  Education Status Is patient currently in school?: No Highest grade of school patient has completed: 8th grade  Risk to self with the past 6 months Suicidal Ideation: No-Not Currently/Within Last 6 Months (Pt had plan to cut himself earlier in evening.) Has patient been a risk to self within the past 6 months prior to admission? : No Suicidal Intent: No-Not Currently/Within Last 6 Months Has patient had any suicidal intent within the past 6 months prior to admission? : Yes Is patient at risk for suicide?: Yes Suicidal Plan?: Yes-Currently Present Has patient had any suicidal plan within the past 6 months prior to admission? : Yes Specify Current Suicidal Plan: Had thoughts of killing himself w/ cutting Access to Means: Yes Specify Access to Suicidal Means: Sharps What has been your use of drugs/alcohol within the last 12 months?: ETOH use Previous Attempts/Gestures: Yes How many times?:  (Once in 2013 jumped from a vehicle.) Other Self Harm Risks: None Triggers for Past Attempts: None known Intentional Self Injurious Behavior: None Family Suicide History: No Recent stressful life event(s): Loss (Comment), Legal Issues, Financial Problems, Turmoil (Comment) (Mother died in last year; DUI court today 11/01) Persecutory voices/beliefs?: Yes Depression: Yes Depression Symptoms: Despondent, Tearfulness, Insomnia, Guilt, Loss of interest in usual pleasures, Feeling worthless/self pity Substance abuse history and/or treatment for substance abuse?: Yes Suicide prevention information given to non-admitted patients: Not  applicable  Risk to Others within the past 6 months Homicidal Ideation: No Does patient have any lifetime risk of violence toward others beyond the six months prior to admission? : No Thoughts of Harm to Others: No Current Homicidal Intent: No Current Homicidal Plan: No Access to Homicidal Means: No Identified Victim: No one History of harm to others?: No Assessment of Violence: None Noted Violent Behavior Description: None reported Does patient have access to weapons?: No Criminal Charges Pending?: Yes Describe Pending Criminal Charges: DUI, revoked license Does patient have a court date: Yes Court Date: 05/19/15 Is patient on probation?: No  Psychosis Hallucinations: None noted Delusions: None noted  Mental Status Report Appearance/Hygiene: Disheveled, In scrubs Eye Contact: Fair Motor Activity: Freedom of movement, Unremarkable Speech: Logical/coherent, Soft Level of Consciousness: Alert, Crying Mood: Depressed, Despair, Anxious, Helpless, Sad, Worthless, low self-esteem Affect: Anxious, Sad, Depressed Anxiety Level: Panic Attacks Panic attack frequency: 1-2 times in a week Most recent panic attack: Can't recall Thought Processes: Coherent, Relevant Judgement: Impaired Orientation: Person, Place, Time, Situation Obsessive Compulsive Thoughts/Behaviors: None  Cognitive Functioning Concentration: Fair Memory: Recent Intact, Remote Intact IQ: Average Insight: Fair Impulse Control: Fair Appetite: Good Weight Loss: 0  Weight Gain: 0 Sleep: Decreased Total Hours of Sleep: 5 Vegetative Symptoms: None  ADLScreening [facility] Assessment Services) Patient's cognitive ability adequate to safely complete daily activities?: Yes Patient able to express need for assistance with ADLs?: Yes Independently performs ADLs?: Yes (appropriate for developmental age)  Prior Inpatient Therapy Prior Inpatient Therapy: Yes Prior Therapy Dates: 2013 [facility] Reason for Treatment: SI  Prior Outpatient Therapy Prior Outpatient Therapy: Yes Prior Therapy Dates: 2013-2014 Prior Therapy Facilty/Provider(s): [facility]  Reason for Treatment: med management Does patient have an ACCT team?: No Does patient have Intensive In-House Services?  : No Does patient have [facility] services? : No Does patient have [facility] services?: No  ADL Screening (condition at time of admission) Patient's cognitive ability adequate to safely complete daily activities?: Yes Is the patient deaf or have difficulty hearing?: No Does the patient have difficulty seeing, even when wearing glasses/contacts?: No (Starting to need reading glasses.) Does the patient have difficulty concentrating, remembering, or making decisions?: No Patient able to express need for assistance with ADLs?: Yes Does the patient have difficulty dressing or bathing?: No Independently performs ADLs?: Yes (appropriate for developmental age) Does the patient have difficulty walking or climbing stairs?: No Weakness of Legs: None Weakness of Arms/Hands: None       Abuse/Neglect Assessment (Assessment to be complete while patient is alone) Physical Abuse: Denies Verbal Abuse: Yes, past (Comment) (Some emotional abuse growing up.) Sexual Abuse: Denies Exploitation of patient/patient's resources: Denies Self-Neglect: Denies     Merchant navy officer (For Healthcare) Does patient have an advance directive?: No Would patient like information on creating an advanced directive?: No - patient declined information    Additional Information 1:1 In Past 12 Months?: No CIRT Risk: No Elopement Risk: No Does patient have medical clearance?: Yes     Disposition:  Disposition Initial Assessment Completed for this Encounter: Yes Disposition of Patient: Inpatient treatment program, Referred to Type of inpatient treatment program: Adult Patient referred to:  (Pt to be reviewed by PA)  Beatriz Stallion  Ray 05/19/2015 6:21 AM

## 2015-05-19 NOTE — Tx Team (Signed)
Initial Interdisciplinary Treatment Plan   PATIENT STRESSORS: Financial difficulties Marital or family conflict Occupational concerns Substance abuse   PATIENT STRENGTHS: Ability for insight Communication skills Physical Health   PROBLEM LIST: Problem List/Patient Goals Date to be addressed Date deferred Reason deferred Estimated date of resolution  "stop worrying too much."  05/19/15     "stop being depressed." 05/19/15     substance abuse 05/19/15     homelessness 05/19/15                                    DISCHARGE CRITERIA:  Adequate post-discharge living arrangements Improved stabilization in mood, thinking, and/or behavior Motivation to continue treatment in a less acute level of care Reduction of life-threatening or endangering symptoms to within safe limits  PRELIMINARY DISCHARGE PLAN: Attend aftercare/continuing care group Attend PHP/IOP Attend 12-step recovery group Outpatient therapy  PATIENT/FAMIILY INVOLVEMENT: This treatment plan has been presented to and reviewed with the patient, Darrell Hawkins, and/or family member.  The patient and family have been given the opportunity to ask questions and make suggestions.  Darrell Hawkins 05/19/2015, 4:23 PM

## 2015-05-20 DIAGNOSIS — F332 Major depressive disorder, recurrent severe without psychotic features: Secondary | ICD-10-CM | POA: Diagnosis not present

## 2015-05-20 DIAGNOSIS — F10230 Alcohol dependence with withdrawal, uncomplicated: Secondary | ICD-10-CM

## 2015-05-20 DIAGNOSIS — F1094 Alcohol use, unspecified with alcohol-induced mood disorder: Secondary | ICD-10-CM

## 2015-05-20 MED ORDER — TRAZODONE HCL 100 MG PO TABS: 100.0000 mg | ORAL_TABLET | Freq: Every evening | ORAL | Status: DC | PRN

## 2015-05-20 MED ORDER — NICOTINE 21 MG/24HR TD PT24
21.0000 mg | MEDICATED_PATCH | Freq: Every day | TRANSDERMAL | Status: DC
  Administered 2015-05-20: 21 mg via TRANSDERMAL
  Filled 2015-05-20 (×4): qty 1

## 2015-05-20 MED ORDER — NICOTINE 21 MG/24HR TD PT24: 21.0000 mg | MEDICATED_PATCH | Freq: Every day | TRANSDERMAL | Status: DC

## 2015-05-20 NOTE — Progress Notes (Signed)
Pt discharged home with a bus pass. Pt was ambulatory and well at the time of discharge. All papers and prescriptions were given and valuables returned. [place]  understanding expressed. Denies SI/HI and A/VH. Pt given opportunity to express concerns and asked questions.

## 2015-05-20 NOTE — [facility] Group Notes (Signed)
[facility] LCSW Aftercare Discharge Planning Group Note  05/20/2015  8:45 AM  Participation Quality: Did Not Attend. Patient invited to participate but declined.  Ott Zimmerle, MSW, LCSWA Clinical Social Worker [facility] [phone]   

## 2015-05-20 NOTE — Tx Team (Signed)
Interdisciplinary Treatment Plan Update (Adult) Date: 05/20/2015   Time Reviewed: 9:30 AM  Progress in Treatment: Attending groups: Intermittently Participating in groups: Minimally Taking medication as prescribed: Yes Tolerating medication: Yes Family/Significant other contact made: No, patient has declined for CSW to make collateral contact Patient understands diagnosis: Yes Discussing patient identified problems/goals with staff: Yes Medical problems stabilized or resolved: Yes Denies suicidal/homicidal ideation: Yes Issues/concerns per patient self-inventory: Yes Other:  New problem(s) identified: N/A  Discharge Plan or Barriers:     Reason for Continuation of Hospitalization:  Depression Anxiety Medication Stabilization   Comments: N/A  Estimated length of stay: Discharge anticipated for today 05/20/15   Patient is a 44 year old Caucasian male admitted after he threatened to kill himself with a razor blade while intoxicated. Patient lives in [place] with his long time girlfriend off and on. Stressors include: lack of stable housing and social supports. Patient has identified supports as: his girlfriend. Patient hopes to return back with his girlfriend to follow up with [facility]. Patient will benefit from crisis stabilization, medication evaluation, group therapy, and psycho education in addition to case management for discharge planning. Patient and CSW reviewed pt's identified goals and treatment plan. Pt verbalized understanding and agreed to treatment plan.       Review of initial/current patient goals per problem list:  1. Goal(s): Patient will participate in aftercare plan   Met: Yes   Target date: 3-5 days post admission date   As evidenced by: Patient will participate within aftercare plan AEB aftercare provider and housing plan at discharge being identified.  11/2: Goal met: Patient plans to return home with girlfriend to follow up with  [facility].     2. Goal (s): Patient will exhibit decreased depressive symptoms and suicidal ideations.   Met: Adequate for discharge per MD   Target date: 3-5 days post admission date   As evidenced by: Patient will utilize self rating of depression at 3 or below and demonstrate decreased signs of depression or be deemed stable for discharge by MD.  11/2: Adequate for discharge. Patient reports feeling safe for discharge and denies SI.     4. Goal(s): Patient will demonstrate decreased signs of withdrawal due to substance abuse   Met: Yes   Target date: 3-5 days post admission date   As evidenced by: Patient will produce a CIWA/COWS score of 0, have stable vitals signs, and no symptoms of withdrawal  11/2: Goal met: No withdrawal symptoms reported at this time per medical chart.      Attendees: Patient:    Family:    Physician: Dr. Parke Poisson; Dr. Sabra Heck 05/20/2015 9:30 AM  Nursing: Darrol Angel, Salvatore Decent ,RN 05/20/2015 9:30 AM  Clinical Social Worker: Tilden Fossa, [place] 05/20/2015 9:30 AM  Other: Peri Maris, Heather Smart LCSWA  05/20/2015 9:30 AM  Other:  05/20/2015 9:30 AM  Other: Lars Pinks, Case Manager 05/20/2015 9:30 AM  Other: Nino Parsley, NP 05/20/2015 9:30 AM  Other:    Other:         Scribe for Treatment Team:  Tilden Fossa, MSW, [place] [phone]

## 2015-05-20 NOTE — Discharge Summary (Signed)
Physician Discharge Summary Note  Patient:  Darrell Hawkins is an 44 y.o., male MRN:  [id number] DOB:  01-05-1971 Patient phone:  [phone] (home)  Patient address:   [address] [address],  Total Time spent with patient: 45 minutes  Date of Admission:  05/19/2015 Date of Discharge: 05/20/2015  Reason for Admission:   dHistory of Present Illness:: 44 Y/o male who states he has been depressed fighting with his GF worrying about everything supposed to be in court today driving with a revoked license and a DWI. Was staying with his GF got in an argument she kicked him out. States he was wanting to end it. She called the police. He left the apartment complex. He walked around in the woods he saw the police was looking for him so he turned on the light in his phone. They located him. The police brought him to the ED. Has been drinking. Usual in the afternoon a beer glass of wine. Weekends fri sat 6-8. Now every other day 4-5. Increased use during the last 2 weeks.   The initial assessment is as follows: Darrell Hawkins is an 44 y.o. male.Patient's gf had called the police because patient had gotten a razor blade and threatened to kill himself with it. Patient admits to having a suicide attempt in 2013. Patient has been drinking tonight. Pt admits to feeling like killing himself tonight. He has significant stressors in his life. He has financial problems, mother died about a year ago; relationship problems with gf and a court date for DUI and driving while license revoked. Court date is today (11/01). Patient said that he is essentially homeless. When he and gf are on the outs he is homeless. Patient has a previous suicide attempt in 2013. Patient had jumped from a moving vehicle. He went to [employer]. Patient had followed up with [facility] for awhile but has not been on medication for over two years. Patient says that he drinks up to a 6 pack on the weekends and a  glass of wine with a meal. Patient says he used to "drink like a fish.' He says he has had some increase in drinking over the last two weeks. Patient says he uses marijuana 1-2 times in a month. Patient is tearful at times. He looks down a lot and talks about how bad his life has been. Patient was adopted by grandparents after birthmother gave him up. He has been in touch with her, this is the one that died this past year. Father works across the street from him but does not initiate contact, pt has to go and see him occasionally.   Principal Problem: Recurrent major depression [facility]) Discharge Diagnoses: Patient Active Problem List   Diagnosis Date Noted  . Alcohol dependence with uncomplicated withdrawal ([place]) [F10.230] 05/19/2015  . Alcohol-induced mood disorder ([place]) [F10.94] 05/19/2015  . Recurrent major depression ([place]) [F33.9] 05/19/2015    Musculoskeletal: Strength & Muscle Tone: within normal limits Gait & Station: normal Patient leans: N/A  Psychiatric Specialty Exam: Physical Exam  Review of Systems  Psychiatric/Behavioral: Positive for depression and substance abuse (ETOH). Negative for suicidal ideas and hallucinations. The patient is nervous/anxious and has insomnia.   All other systems reviewed and are negative.   Blood pressure 118/72, pulse 76, temperature 97.3 F (36.3 C), temperature source Oral, resp. rate 18, height _0  (1.727 m), weight 73.029 kg (161 lb), SpO2 98 %.Body mass index is 24.49 kg/(m^2).  SEE MD PSE within  the SRA   Have you used any form of tobacco in the last 30 days? (Cigarettes, Smokeless Tobacco, Cigars, and/or Pipes): No  Has this patient used any form of tobacco in the last 30 days? (Cigarettes, Smokeless Tobacco, Cigars, and/or Pipes) Yes, A prescription for an FDA-approved tobacco cessation medication was offered at discharge and the patient accepted.  Past Medical History:  Past Medical History  Diagnosis Date  . Depression     History reviewed. No pertinent past surgical history. Family History: History reviewed. No pertinent family history. Social History:  History  Alcohol Use  . Yes    Comment: occ     History  Drug Use  . Yes  . Special: Marijuana    Comment: "once in a blue moon"    Social History   Social History  . Marital Status: Single    Spouse Name: N/A  . Number of Children: N/A  . Years of Education: N/A   Social History Main Topics  . Smoking status: Current Every Day Smoker -- 1.00 packs/day  . Smokeless tobacco: None  . Alcohol Use: Yes     Comment: occ  . Drug Use: Yes    Special: Marijuana     Comment: "once in a blue moon"  . Sexual Activity: Not Asked   Other Topics Concern  . None   Social History Narrative    Risk to Self: Is patient at risk for suicide?: No What has been your use of drugs/alcohol within the last 12 months?: Regular ETOH use Risk to Others:   Prior Inpatient Therapy:   Prior Outpatient Therapy:    Level of Care:  OP  Hospital Course:   Darrell Hawkins was admitted for Recurrent major depression ([place]), and crisis management.  Pt was treated discharged with the medications listed below under Medication List.  Medical problems were identified and treated as needed.  Home medications were restarted as appropriate.  Improvement was monitored by observation and Darrell Hawkins 's daily report of symptom reduction.  Emotional and mental status was monitored by daily self-inventory reports completed by Darrell Hawkins and clinical staff.         Darrell Hawkins was evaluated by the treatment team for stability and plans for continued recovery upon discharge. Darrell Hawkins 's motivation was an integral factor for scheduling further treatment. Employment, transportation, bed availability, health status, family support, and any pending legal issues were also considered during hospital stay. Pt was offered further treatment options upon  discharge including but not limited to Residential, Intensive Outpatient, and Outpatient treatment.  Darrell Hawkins will follow up with the services as listed below under Follow Up Information.     Upon completion of this admission the patient was both mentally and medically stable for discharge denying suicidal/homicidal ideation, auditory/visual/tactile hallucinations, delusional thoughts and paranoia.    Consults:  None  Significant Diagnostic Studies:  BAL 79, UDS negative  Discharge Vitals:   Blood pressure 118/72, pulse 76, temperature 97.3 F (36.3 C), temperature source Oral, resp. rate 18, height _0  (1.727 m), weight 73.029 kg (161 lb), SpO2 98 %. Body mass index is 24.49 kg/(m^2). Lab Results:   Results for orders placed or performed during the hospital encounter of 05/19/15 (from the past 72 hour(s))  Comprehensive metabolic panel     Status: Abnormal   Collection Time: 05/19/15  3:49 AM  Result Value Ref Range   Sodium 139 135 - 145 mmol/L   Potassium  3.9 3.5 - 5.1 mmol/L   Chloride 107 101 - 111 mmol/L   CO2 24 22 - 32 mmol/L   Glucose, Bld 99 65 - 99 mg/dL   BUN 8 6 - 20 mg/dL   Creatinine, Ser 0.83 0.61 - 1.24 mg/dL   Calcium 8.4 (L) 8.9 - 10.3 mg/dL   Total Protein 6.3 (L) 6.5 - 8.1 g/dL   Albumin 3.7 3.5 - 5.0 g/dL   AST 17 15 - 41 U/L   ALT 12 (L) 17 - 63 U/L   Alkaline Phosphatase 58 38 - 126 U/L   Total Bilirubin 0.7 0.3 - 1.2 mg/dL   GFR calc non Af Amer >60 >60 mL/min   GFR calc Af Amer >60 >60 mL/min    Comment: (NOTE) The eGFR has been calculated using the CKD EPI equation. This calculation has not been validated in all clinical situations. eGFR's persistently <60 mL/min signify possible Chronic Kidney Disease.    Anion gap 8 5 - 15  CBC     Status: None   Collection Time: 05/19/15  3:49 AM  Result Value Ref Range   WBC 6.9 4.0 - 10.5 K/uL   RBC 5.03 4.22 - 5.81 MIL/uL   Hemoglobin 15.1 13.0 - 17.0 g/dL   HCT 44.6 39.0 - 52.0 %   MCV 88.7  78.0 - 100.0 fL   MCH 30.0 26.0 - 34.0 pg   MCHC 33.9 30.0 - 36.0 g/dL   RDW 13.5 11.5 - 15.5 %   Platelets 214 150 - 400 K/uL  Ethanol (ETOH)     Status: Abnormal   Collection Time: 05/19/15  3:50 AM  Result Value Ref Range   Alcohol, Ethyl (B) 79 (H) <5 mg/dL    Comment:        LOWEST DETECTABLE LIMIT FOR SERUM ALCOHOL IS 5 mg/dL FOR MEDICAL PURPOSES ONLY   Salicylate level     Status: None   Collection Time: 05/19/15  3:50 AM  Result Value Ref Range   Salicylate Lvl <9.3 2.8 - 30.0 mg/dL  Acetaminophen level     Status: Abnormal   Collection Time: 05/19/15  3:50 AM  Result Value Ref Range   Acetaminophen (Tylenol), Serum <10 (L) 10 - 30 ug/mL    Comment:        THERAPEUTIC CONCENTRATIONS VARY SIGNIFICANTLY. A RANGE OF 10-30 ug/mL MAY BE AN EFFECTIVE CONCENTRATION FOR MANY PATIENTS. HOWEVER, SOME ARE BEST TREATED AT CONCENTRATIONS OUTSIDE THIS RANGE. ACETAMINOPHEN CONCENTRATIONS >150 ug/mL AT 4 HOURS AFTER INGESTION AND >50 ug/mL AT 12 HOURS AFTER INGESTION ARE OFTEN ASSOCIATED WITH TOXIC REACTIONS.   Urine rapid drug screen (hosp performed) (Not at [facility])     Status: None   Collection Time: 05/19/15  5:44 AM  Result Value Ref Range   Opiates NONE DETECTED NONE DETECTED   Cocaine NONE DETECTED NONE DETECTED   Benzodiazepines NONE DETECTED NONE DETECTED   Amphetamines NONE DETECTED NONE DETECTED   Tetrahydrocannabinol NONE DETECTED NONE DETECTED   Barbiturates NONE DETECTED NONE DETECTED    Comment:        DRUG SCREEN FOR MEDICAL PURPOSES ONLY.  IF CONFIRMATION IS NEEDED FOR ANY PURPOSE, NOTIFY LAB WITHIN 5 DAYS.        LOWEST DETECTABLE LIMITS FOR URINE DRUG SCREEN Drug Class       Cutoff (ng/mL) Amphetamine      1000 Barbiturate      200 Benzodiazepine   810 Tricyclics       175 Opiates  300 Cocaine          300 THC              50     Physical Findings: AIMS: Facial and Oral Movements Muscles of Facial Expression: None, normal Lips and  Perioral Area: None, normal Jaw: None, normal Tongue: None, normal,Extremity Movements Upper (arms, wrists, hands, fingers): None, normal Lower (legs, knees, ankles, toes): None, normal, Trunk Movements Neck, shoulders, hips: None, normal, Overall Severity Severity of abnormal movements (highest score from questions above): None, normal Incapacitation due to abnormal movements: None, normal Patient's awareness of abnormal movements (rate only patient's report): No Awareness, Dental Status Current problems with teeth and/or dentures?: No Does patient usually wear dentures?: Yes  CIWA:  CIWA-Ar Total: 0 COWS:  COWS Total Score: 0   See Psychiatric Specialty Exam and Suicide Risk Assessment completed by Attending Physician prior to discharge.  Discharge destination:  Home  Is patient on multiple antipsychotic therapies at discharge:  No   Has Patient had three or more failed trials of antipsychotic monotherapy by history:  No    Recommended Plan for Multiple Antipsychotic Therapies: NA     Medication List    TAKE these medications      Indication   albuterol 108 (90 BASE) MCG/ACT inhaler  Commonly known as:  PROVENTIL HFA;VENTOLIN HFA  Inhale 2 puffs into the lungs every 4 (four) hours as needed for wheezing.      traZODone 100 MG tablet  Commonly known as:  DESYREL  Take 1 tablet (100 mg total) by mouth at bedtime as needed for sleep.   Indication:  Trouble Sleeping           Follow-up Information    Follow up with [facility].   Specialty:  Behavioral Health   Why:  Walk-in clinic Monday-Friday between 8am to 3pm for assessment for therapy and medications. Arrive early in the morning to be seen as soon as possible.   Contact information:   [address] [place] [id number] [phone]       Follow-up recommendations:  Activity:  As tolearted Diet:  Heart healthy with low sodium. Other:  Continue outpatient, seek outpatient followup for psychiatry and  counseling  Comments:   Take all medications as prescribed. Keep all follow-up appointments as scheduled.  Do not consume alcohol or use illegal drugs while on prescription medications. Report any adverse effects from your medications to your primary care provider promptly.  In the event of recurrent symptoms or worsening symptoms, call 911, a crisis hotline, or go to the nearest emergency department for evaluation.   Total Discharge Time: Greater than 30 minutes  Signed: Benjamine Mola, FNP-BC 05/20/2015, 1:40 PM  I personally assessed the patient and formulated the plan Darrell Hawkins, M.D.

## 2015-05-20 NOTE — Progress Notes (Signed)
  [facility]  Will you be returning to the same living situation after discharge:  Yes,  patient plans to return home with girlfriend At discharge, do you have transportation home?: Yes,  patient provided with bus pass Do you have the ability to pay for your medications: Yes,  patient will be provided with prescriptions  Release of information consent forms completed and in the chart;  Patient's signature needed at discharge.  Patient to Follow up at: Follow-up Information    [facility]   Specialty:  Behavioral Health   Why:  Walk-in clinic Monday-Friday between 8am to 3pm for assessment for therapy and medications. Arrive early in the morning to be seen as soon as possible.   Contact information:   [address] [place] [address] [phone]       Patient denies SI/HI: Yes,  denies    Safety Planning and Suicide Prevention discussed: Yes,  with patient  Have you used any form of tobacco in the last 30 days? (Cigarettes, Smokeless Tobacco, Cigars, and/or Pipes): No  Has patient been referred to the Quitline?: N/A patient is not a smoker  Tylyn Derwin, Belenda CruiseKristin L 05/20/2015, 3:17 PM

## 2016-02-03 ENCOUNTER — Inpatient Hospital Stay (HOSPITAL_COMMUNITY)
Admission: EM | Admit: 2016-02-03 | Discharge: 2016-02-06 | DRG: 392 | Disposition: A | Discharge status: Home / Self Care | Payer: Self-pay | Attending: Internal Medicine | Admitting: Internal Medicine

## 2016-02-03 ENCOUNTER — Emergency Department (HOSPITAL_COMMUNITY): Payer: Self-pay

## 2016-02-03 ENCOUNTER — Encounter (HOSPITAL_COMMUNITY): Payer: Self-pay | Admitting: Family Medicine

## 2016-02-03 DIAGNOSIS — K5721 Diverticulitis of large intestine with perforation and abscess with bleeding: Secondary | ICD-10-CM

## 2016-02-03 DIAGNOSIS — K5792 Diverticulitis of intestine, part unspecified, without perforation or abscess without bleeding: Secondary | ICD-10-CM | POA: Diagnosis present

## 2016-02-03 DIAGNOSIS — G4700 Insomnia, unspecified: Secondary | ICD-10-CM | POA: Insufficient documentation

## 2016-02-03 DIAGNOSIS — R918 Other nonspecific abnormal finding of lung field: Secondary | ICD-10-CM

## 2016-02-03 DIAGNOSIS — F1290 Cannabis use, unspecified, uncomplicated: Secondary | ICD-10-CM | POA: Diagnosis present

## 2016-02-03 DIAGNOSIS — E8809 Other disorders of plasma-protein metabolism, not elsewhere classified: Secondary | ICD-10-CM | POA: Diagnosis present

## 2016-02-03 DIAGNOSIS — F17210 Nicotine dependence, cigarettes, uncomplicated: Secondary | ICD-10-CM | POA: Diagnosis present

## 2016-02-03 DIAGNOSIS — K5732 Diverticulitis of large intestine without perforation or abscess without bleeding: Principal | ICD-10-CM | POA: Diagnosis present

## 2016-02-03 DIAGNOSIS — F1020 Alcohol dependence, uncomplicated: Secondary | ICD-10-CM

## 2016-02-03 DIAGNOSIS — K5720 Diverticulitis of large intestine with perforation and abscess without bleeding: Secondary | ICD-10-CM | POA: Insufficient documentation

## 2016-02-03 DIAGNOSIS — F329 Major depressive disorder, single episode, unspecified: Secondary | ICD-10-CM | POA: Diagnosis present

## 2016-02-03 DIAGNOSIS — Z801 Family history of malignant neoplasm of trachea, bronchus and lung: Secondary | ICD-10-CM

## 2016-02-03 DIAGNOSIS — Z720 Tobacco use: Secondary | ICD-10-CM

## 2016-02-03 HISTORY — DX: Migraine, unspecified, not intractable, without status migrainosus: G43.909

## 2016-02-03 HISTORY — DX: Diverticulitis of intestine, part unspecified, without perforation or abscess without bleeding: K57.92

## 2016-02-03 LAB — CBC WITH DIFFERENTIAL/PLATELET
BASOS ABS: 0 10*3/uL (ref 0.0–0.1)
BASOS PCT: 0 %
EOS ABS: 0 10*3/uL (ref 0.0–0.7)
EOS PCT: 0 %
HEMATOCRIT: 49.4 % (ref 39.0–52.0)
Hemoglobin: 16.5 g/dL (ref 13.0–17.0)
Lymphocytes Relative: 8 %
Lymphs Abs: 1.3 10*3/uL (ref 0.7–4.0)
MCH: 30.3 pg (ref 26.0–34.0)
MCHC: 33.4 g/dL (ref 30.0–36.0)
MCV: 90.6 fL (ref 78.0–100.0)
MONO ABS: 1.6 10*3/uL — AB (ref 0.1–1.0)
MONOS PCT: 9 %
NEUTROS ABS: 13.8 10*3/uL — AB (ref 1.7–7.7)
Neutrophils Relative %: 83 %
PLATELETS: 191 10*3/uL (ref 150–400)
RBC: 5.45 MIL/uL (ref 4.22–5.81)
RDW: 13.5 % (ref 11.5–15.5)
WBC: 16.6 10*3/uL — ABNORMAL HIGH (ref 4.0–10.5)

## 2016-02-03 LAB — COMPREHENSIVE METABOLIC PANEL
ALBUMIN: 3 g/dL — AB (ref 3.5–5.0)
ALK PHOS: 68 U/L (ref 38–126)
ALT: 12 U/L — ABNORMAL LOW (ref 17–63)
AST: 20 U/L (ref 15–41)
Anion gap: 6 (ref 5–15)
BILIRUBIN TOTAL: 1.5 mg/dL — AB (ref 0.3–1.2)
BUN: 7 mg/dL (ref 6–20)
CALCIUM: 8.7 mg/dL — AB (ref 8.9–10.3)
CO2: 24 mmol/L (ref 22–32)
CREATININE: 1.14 mg/dL (ref 0.61–1.24)
Chloride: 105 mmol/L (ref 101–111)
GFR calc Af Amer: >60 mL/min (ref >60)
GLUCOSE: 90 mg/dL (ref 65–99)
POTASSIUM: 4.4 mmol/L (ref 3.5–5.1)
Sodium: 135 mmol/L (ref 135–145)
TOTAL PROTEIN: 5.7 g/dL — AB (ref 6.5–8.1)

## 2016-02-03 LAB — URINE MICROSCOPIC-ADD ON: RBC / HPF: NONE SEEN RBC/hpf (ref 0–5)

## 2016-02-03 LAB — PREALBUMIN: PREALBUMIN: 9.3 mg/dL — AB (ref 18–38)

## 2016-02-03 LAB — URINALYSIS, ROUTINE W REFLEX MICROSCOPIC
BILIRUBIN URINE: NEGATIVE
Glucose, UA: 100 mg/dL — AB
Ketones, ur: NEGATIVE mg/dL
LEUKOCYTES UA: NEGATIVE
NITRITE: NEGATIVE
PH: 5.5 (ref 5.0–8.0)
PROTEIN: 30 mg/dL — AB
Specific Gravity, Urine: 1.017 (ref 1.005–1.030)

## 2016-02-03 LAB — LIPASE, BLOOD: LIPASE: 18 U/L (ref 11–51)

## 2016-02-03 MED ORDER — SODIUM CHLORIDE 0.9 % IV BOLUS (SEPSIS)
1000.0000 mL | Freq: Once | INTRAVENOUS | Status: AC
  Administered 2016-02-03: 1000 mL via INTRAVENOUS

## 2016-02-03 MED ORDER — SODIUM CHLORIDE 0.9 % IV BOLUS (SEPSIS)
1000.0000 mL | Freq: Once | INTRAVENOUS | Status: AC
Start: 2016-02-03 — End: 2016-02-03
  Administered 2016-02-03: 1000 mL via INTRAVENOUS

## 2016-02-03 MED ORDER — HYDROMORPHONE HCL 1 MG/ML IJ SOLN
1.0000 mg | INTRAMUSCULAR | Status: DC | PRN
  Administered 2016-02-03 – 2016-02-05 (×9): 1 mg via INTRAVENOUS
  Filled 2016-02-03 (×10): qty 1

## 2016-02-03 MED ORDER — THIAMINE HCL 100 MG/ML IJ SOLN: 100.0000 mg | Freq: Every day | INTRAMUSCULAR | Status: DC

## 2016-02-03 MED ORDER — ONDANSETRON HCL 4 MG PO TABS: 4.0000 mg | ORAL_TABLET | Freq: Four times a day (QID) | ORAL | Status: DC | PRN

## 2016-02-03 MED ORDER — CHLORHEXIDINE GLUCONATE 0.12 % MT SOLN
15.0000 mL | Freq: Two times a day (BID) | OROMUCOSAL | Status: DC
  Administered 2016-02-03 – 2016-02-06 (×5): 15 mL via OROMUCOSAL
  Filled 2016-02-03 (×5): qty 15

## 2016-02-03 MED ORDER — HYDROMORPHONE HCL 1 MG/ML IJ SOLN
1.0000 mg | Freq: Once | INTRAMUSCULAR | Status: AC
  Administered 2016-02-03: 1 mg via INTRAVENOUS
  Filled 2016-02-03: qty 1

## 2016-02-03 MED ORDER — ADULT MULTIVITAMIN W/MINERALS CH
1.0000 | ORAL_TABLET | Freq: Every day | ORAL | Status: DC
  Administered 2016-02-03 – 2016-02-06 (×4): 1 via ORAL
  Filled 2016-02-03 (×4): qty 1

## 2016-02-03 MED ORDER — IOPAMIDOL (ISOVUE-300) INJECTION 61%
INTRAVENOUS | Status: AC
  Administered 2016-02-03: 100 mL
  Filled 2016-02-03: qty 100

## 2016-02-03 MED ORDER — LORAZEPAM 1 MG PO TABS: 1.0000 mg | ORAL_TABLET | Freq: Four times a day (QID) | ORAL | Status: AC | PRN

## 2016-02-03 MED ORDER — TRAZODONE HCL 100 MG PO TABS: 100.0000 mg | ORAL_TABLET | Freq: Every evening | ORAL | Status: DC | PRN

## 2016-02-03 MED ORDER — LORAZEPAM 2 MG/ML IJ SOLN
1.0000 mg | Freq: Four times a day (QID) | INTRAMUSCULAR | Status: AC | PRN
  Administered 2016-02-04: 1 mg via INTRAVENOUS
  Filled 2016-02-03: qty 1

## 2016-02-03 MED ORDER — SODIUM CHLORIDE 0.9 % IV SOLN
INTRAVENOUS | Status: DC
  Administered 2016-02-03 – 2016-02-06 (×3): via INTRAVENOUS

## 2016-02-03 MED ORDER — METRONIDAZOLE IN NACL 5-0.79 MG/ML-% IV SOLN
500.0000 mg | Freq: Three times a day (TID) | INTRAVENOUS | Status: DC
  Administered 2016-02-03 – 2016-02-04 (×2): 500 mg via INTRAVENOUS
  Filled 2016-02-03 (×4): qty 100

## 2016-02-03 MED ORDER — VITAMIN B-1 100 MG PO TABS
100.0000 mg | ORAL_TABLET | Freq: Every day | ORAL | Status: DC
  Administered 2016-02-03 – 2016-02-06 (×4): 100 mg via ORAL
  Filled 2016-02-03 (×4): qty 1

## 2016-02-03 MED ORDER — CIPROFLOXACIN IN D5W 400 MG/200ML IV SOLN
400.0000 mg | Freq: Two times a day (BID) | INTRAVENOUS | Status: DC
  Administered 2016-02-03 – 2016-02-04 (×2): 400 mg via INTRAVENOUS
  Filled 2016-02-03 (×3): qty 200

## 2016-02-03 MED ORDER — ONDANSETRON HCL 4 MG/2ML IJ SOLN
4.0000 mg | Freq: Once | INTRAMUSCULAR | Status: AC
  Administered 2016-02-03: 4 mg via INTRAVENOUS
  Filled 2016-02-03: qty 2

## 2016-02-03 MED ORDER — FOLIC ACID 1 MG PO TABS
1.0000 mg | ORAL_TABLET | Freq: Every day | ORAL | Status: DC
  Administered 2016-02-03 – 2016-02-06 (×4): 1 mg via ORAL
  Filled 2016-02-03 (×4): qty 1

## 2016-02-03 MED ORDER — CEFAZOLIN SODIUM-DEXTROSE 2-4 GM/100ML-% IV SOLN
2.0000 g | Freq: Once | INTRAVENOUS | Status: AC
  Administered 2016-02-03: 2 g via INTRAVENOUS
  Filled 2016-02-03: qty 100

## 2016-02-03 MED ORDER — ACETAMINOPHEN 650 MG RE SUPP: 650.0000 mg | Freq: Four times a day (QID) | RECTAL | Status: DC | PRN

## 2016-02-03 MED ORDER — ONDANSETRON HCL 4 MG/2ML IJ SOLN: 4.0000 mg | Freq: Four times a day (QID) | INTRAMUSCULAR | Status: DC | PRN

## 2016-02-03 MED ORDER — CETYLPYRIDINIUM CHLORIDE 0.05 % MT LIQD
7.0000 mL | Freq: Two times a day (BID) | OROMUCOSAL | Status: DC
  Administered 2016-02-04 (×2): 7 mL via OROMUCOSAL

## 2016-02-03 MED ORDER — METRONIDAZOLE IN NACL 5-0.79 MG/ML-% IV SOLN
500.0000 mg | Freq: Once | INTRAVENOUS | Status: AC
  Administered 2016-02-03: 500 mg via INTRAVENOUS
  Filled 2016-02-03: qty 100

## 2016-02-03 MED ORDER — ACETAMINOPHEN 325 MG PO TABS: 650.0000 mg | ORAL_TABLET | Freq: Four times a day (QID) | ORAL | Status: DC | PRN

## 2016-02-03 MED ORDER — GI COCKTAIL ~~LOC~~
30.0000 mL | Freq: Once | ORAL | Status: AC
  Administered 2016-02-03: 30 mL via ORAL
  Filled 2016-02-03: qty 30

## 2016-02-03 MED ORDER — ENOXAPARIN SODIUM 40 MG/0.4ML ~~LOC~~ SOLN
40.0000 mg | SUBCUTANEOUS | Status: DC
  Administered 2016-02-03 – 2016-02-06 (×4): 40 mg via SUBCUTANEOUS
  Filled 2016-02-03 (×4): qty 0.4

## 2016-02-03 NOTE — ED Notes (Signed)
PA at bedside.

## 2016-02-03 NOTE — ED Notes (Signed)
Pt here for lower abd pain that started out as back pain on Tuesday. Pt jumps when touching his lower abdomen. Denies any N,V. sts fever, chills. sts pain right at his belt line. sts he took some ibuprofen last night that helped a little,. sts pain with urination and BM.

## 2016-02-03 NOTE — ED Provider Notes (Signed)
CSN: [id number]     Arrival date & time 02/03/16  3474 History   First MD Initiated Contact with Patient 02/03/16 0831     Chief Complaint  Patient presents with  . Back Pain  . Abdominal Pain    HPI   Darrell Hawkins is an 45 y.o. male with history of depression who presents tot he ED for evaluation of abdominal pain. 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 He states the back pain improved but the next morning (yesterday) he started noticing diffuse abdominal pain. The pain has progressed until today and he now complains of generalized abdominal pain, the worst in his lower abdomen. He states he took ibuprofen last night which temporarily provided mild improvement. Denies n/v, does endorse a couple looser and smaller than average stools. States the abdominal pain is worse with urination and BM. However, denies dysuria, urinary frequency/urgency, or gross hematuria. He denies h/o abdominal surgeries. Admits to drinking 40 oz of beer daily on average. Denies other illict drug use.   Past Medical History  Diagnosis Date  . Depression    No past surgical history on file. No family history on file. Social History  Substance Use Topics  . Smoking status: Current Every Day Smoker -- 1.00 packs/day  . Smokeless tobacco: Not on file  . Alcohol Use: Yes     Comment: occ    Review of Systems  All other systems reviewed and are negative.     Allergies  Review of patient's allergies indicates no known allergies.  Home Medications   Prior to Admission medications   Medication Sig Start Date End Date Taking? Authorizing Provider  albuterol (PROVENTIL HFA;VENTOLIN HFA) 108 (90 BASE) MCG/ACT inhaler Inhale 2 puffs into the lungs every 4 (four) hours as needed for wheezing. Patient not taking: Reported on 05/19/2015 02/13/14   Graylon Good, PA-C  nicotine (NICODERM CQ - DOSED IN MG/24 HOURS) 21 mg/24hr patch Place 1 patch (21 mg  total) onto the skin daily. 05/20/15   Beau Fanny, FNP  traZODone (DESYREL) 100 MG tablet Take 1 tablet (100 mg total) by mouth at bedtime as needed for sleep (.). 05/20/15   Beau Fanny, FNP   BP 139/89 mmHg  Pulse 105  Temp(Src) 98.4 F (36.9 C) (Oral)  Resp 18  SpO2 100% Physical Exam  Constitutional: He is oriented to person, place, and time.  HENT:  Right Ear: External ear normal.  Left Ear: External ear normal.  Nose: Nose normal.  Mouth/Throat: Oropharynx is clear and moist. No oropharyngeal exudate.  Eyes: Conjunctivae and EOM are normal. Pupils are equal, round, and reactive to light.  Neck: Normal range of motion. Neck supple.  Cardiovascular: Normal rate, regular rhythm, normal heart sounds and intact distal pulses.   Pulmonary/Chest: Effort normal and breath sounds normal. No respiratory distress. He has no wheezes.  Abdominal: Soft. Bowel sounds are normal. He exhibits no distension. There is tenderness. There is guarding. There is no rebound.  Abdomen diffusely tender, particularly lower abdomen. Guarding on palpation of lower abdomen. No rebound. Mild R CVA tenderness  Musculoskeletal: He exhibits no edema.  Neurological: He is alert and oriented to person, place, and time. No cranial nerve deficit.  Skin: Skin is warm and dry.  Psychiatric: He has a normal mood and affect.  Nursing note and vitals reviewed.   ED Course  Procedures (including critical care time) Labs Review Labs Reviewed  COMPREHENSIVE METABOLIC PANEL - Abnormal; Notable for the following:    Calcium 8.7 (*)    Total Protein 5.7 (*)    Albumin 3.0 (*)    ALT 12 (*)    Total Bilirubin 1.5 (*)    All other components within normal limits  CBC WITH DIFFERENTIAL/PLATELET - Abnormal; Notable for the following:    WBC 16.6 (*)    Neutro Abs 13.8 (*)    Monocytes Absolute 1.6 (*)    All other components within normal limits  LIPASE, BLOOD  URINALYSIS, ROUTINE W REFLEX MICROSCOPIC ([facility])    Imaging Review Ct Abdomen Pelvis W Contrast  02/03/2016  CLINICAL DATA:  Right lower quadrant abdominal pain and difficulty urinating since Tuesday morning. Fever and chills. EXAM: CT ABDOMEN AND PELVIS WITH CONTRAST TECHNIQUE: Multidetector CT imaging of the abdomen and pelvis was performed using the standard protocol following bolus administration of intravenous contrast. CONTRAST:  100mL ISOVUE-300 IOPAMIDOL (ISOVUE-300) INJECTION 61% COMPARISON:  None. FINDINGS: Lower chest: Bibasilar dependent atelectasis. Moderate breathing motion artifact. No infiltrates or effusions. No worrisome pulmonary lesions. Suspect small pulmonary nodule in the left lower lobe on image number 1 which measures 5 mm. There is also a right middle lobe nodule measuring 5 mm on image 10. These could be subpleural lymph nodes. They are likely benign. The heart is normal in size. No pericardial effusion. The distal esophagus is grossly normal. Hepatobiliary: No focal hepatic lesions or intrahepatic biliary dilatation. The gallbladder is normal. No common bile duct dilatation. Pancreas: No mass, inflammation or duct dilatation. Spleen: Normal size.  No focal lesions. Adrenals/Urinary Tract: The adrenal glands and kidneys are unremarkable. No inflammatory changes, mass lesions or obstructive findings. A small upper pole left renal calculus is noted incidentally. No obstructing ureteral calculi or bladder calculi. Stomach/Bowel: The stomach and duodenum are unremarkable. Significant inflammation of the mid sigmoid colon consistent with acute diverticulitis. There is marked surrounding inflammation in the sigmoid mesocolon and also significant inflammation adjacent small bowel loops in the pelvis. This is complicated diverticulitis due to a focal walled-off perforation containing air. No discrete abscess. The remainder of the colon is unremarkable. The terminal ileum and appendix are normal. Moderate sigmoid diverticulosis.  Vascular/Lymphatic: Small scattered mesenteric and retroperitoneal lymph nodes but no mass or adenopathy. Age advanced atherosclerotic calcifications involving the aorta iliac arteries but no aneurysm or dissection. Other: The bladder, prostate gland and seminal vesicles are unremarkable. No pelvic mass, adenopathy or free pelvic fluid collections. No inguinal mass or adenopathy. No inguinal hernia. Musculoskeletal: No significant bony findings. IMPRESSION: 1. Acute and severe sigmoid diverticulitis complicated by a focal walled-off perforation within air collection measuring 3 cm but no discrete abscess. Significant inflammation in the sigmoid mesocolon and inflamed adjacent small bowel loops. 2. No other significant acute abdominal/pelvic findings. 3. Age advanced atherosclerotic calcifications involving the aorta and iliac arteries. 4. Lower lobe pulmonary nodules are likely benign. No follow-up needed if patient is low-risk (and has no known or suspected primary neoplasm). Non-contrast chest CT can be considered in 12 months if patient is high-risk. This recommendation follows the consensus statement: Guidelines for Management of Incidental Pulmonary Nodules Detected on CT Images:From the Fleischner Society 2017; published online before print (10.1148/[id number]). Electronically Signed   By: Rudie MeyerP.  Gallerani M.D.   On: 02/03/2016 11:02   I have personally reviewed and evaluated these images and lab results as part of my medical decision-making.   EKG Interpretation None      MDM  Final diagnoses:  Diverticulitis of large intestine with perforation without bleeding    Labs reveal a white count of 16.6, negative lipase, T bili 1.5. UA is pending. On repeat exam pt reports pain is improved. CVA tenderness resolved. Repeat abdominal exam with improvement in tenderness though still quite tender in lower abdomen with guarding, particularly RLQ. Will obtain CT abd/pelvis.  CT reveals an acute,  severe sigmoid diverticulitis with walled-off perforation with air collection (3 cm) no abscess. I spoke with Willette Cluster of the hospitalist team who will see and admit patient. Appreciate assistance.  Carlene Coria, PA-C 02/03/16 1145  Jacalyn Lefevre, MD 02/03/16 [phone]

## 2016-02-03 NOTE — Progress Notes (Signed)
Pt admitted from ED this pm for diverticulitis. Alert, oriented and able to voice needs . No concerns expressed at this time

## 2016-02-03 NOTE — ED Notes (Signed)
Patient transported to CT 

## 2016-02-03 NOTE — H&P (Signed)
History and Physical    TYSHAN ENDERLE ZOX:[id number] DOB: 08-03-1970 DOA: 02/03/2016  PCP: No PCP Per Patient  Patient coming from:    Home    Chief Complaint: Abdominal pain  HPI: Darrell Hawkins is a 45 y.o. male with no significant past medical history. Around 2 AM Monday morning patient was at work hanging check right when he developed abdominal pain. Patient went home from work with what he thought was a pulled muscle. Pain eventually radiated around to lower abdomen where it persisted all day yesterday. The diffuse lower abdominal pain was worse when patient tried to urinate or have a bowel movement. It was worse with movement. Lying on either side with a pillow between his legs seem to be the only thing that provided any relief. Patient had a splint his belly when trying to walk. He reports significant chills. Denies nausea or vomiting . No history of diverticulitis or any other gastrointestinal symptoms.     ED Course:  Temp 99.2. Vital signs stable Wbc 16 point 6. Albumin 3.9, total bili 1.7 2 L normal saline bolus Ancef and metronidazole were ordered  Review of Systems: As per HPI, otherwise 10 point review of systems negative.   Past Medical History  Diagnosis Date  . Depression    Surgical history: Patient has not had any surgeries  Social History   Social History  . Marital Status: Single    Spouse Name: N/A  . Number of Children: N/A  . Years of Education: N/A   Occupational History  . Not on file.   Social History Main Topics  . Smoking status: Current Every Day Smoker -- 1.00 packs/day  . Smokeless tobacco: Not on file  . Alcohol Use: Yes     Comment: occ  . Drug Use: Yes    Special: Marijuana     Comment: "once in a blue moon"  . Sexual Activity: Not on file   Other Topics Concern  . Not on file   Social History Narrative   Lives at home with girlfriend. No assistive device as needed for ambulation  drinks 40 ounces of beer a  day Smoker, 1 pack per day No Known Allergies  Family medical history: Mother had lung cancer  Prior to Admission medications   Medication Sig Start Date End Date Taking? Authorizing Provider  traZODone (DESYREL) 100 MG tablet Take 1 tablet (100 mg total) by mouth at bedtime as needed for sleep (.). 05/20/15  Yes Beau Fanny, FNP    Physical Exam: Filed Vitals:   02/03/16 0832 02/03/16 1141  BP: 139/89 121/78  Pulse: 105 74  Temp: 98.4 F (36.9 C) 99.2 F (37.3 C)  TempSrc: Oral Oral  Resp: 18 16  SpO2: 100% 97%    Constitutional:  Well-developed white male in NAD, calm, comfortable Filed Vitals:   02/03/16 0832 02/03/16 1141  BP: 139/89 121/78  Pulse: 105 74  Temp: 98.4 F (36.9 C) 99.2 F (37.3 C)  TempSrc: Oral Oral  Resp: 18 16  SpO2: 100% 97%   Eyes: PER, lids and conjunctivae normal ENMT: Mucous membranes are moist. Posterior pharynx clear of any exudate or lesions..  Neck: normal, supple, no masses Respiratory: clear to auscultation bilaterally, no wheezing, no crackles. Normal respiratory effort. No accessory muscle use.  Cardiovascular: Sinus tachycardia, 105. No extremity edema. 2+ pedal pulses.   Abdomen:  Soft, nondistended , significant diffuse lower abdominal tenderness . No masses palpated. . Bowel sounds positive.  Musculoskeletal: no  clubbing / cyanosis. No joint deformity upper and lower extremities. Good ROM, no contractures. Normal muscle tone.  Skin: no rashes, lesions, ulcers. Numerous tattoos  Neurologic: CN 2-12 grossly intact. Sensation intact, Strength 5/5 in all 4.  Psychiatric: Normal judgment and insight. Alert and oriented x 3. Normal mood.    Labs on Admission: I have personally reviewed following labs and imaging studies   Urine analysis:    Component Value Date/Time   COLORURINE AMBER* 02/03/2016 0937   APPEARANCEUR CLOUDY* 02/03/2016 0937   LABSPEC 1.017 02/03/2016 0937   PHURINE 5.5 02/03/2016 0937   GLUCOSEU 100*  02/03/2016 0937   HGBUR TRACE* 02/03/2016 0937   BILIRUBINUR NEGATIVE 02/03/2016 0937   KETONESUR NEGATIVE 02/03/2016 0937   PROTEINUR 30* 02/03/2016 0937   NITRITE NEGATIVE 02/03/2016 0937   LEUKOCYTESUR NEGATIVE 02/03/2016 0937    Radiological Exams on Admission: Ct Abdomen Pelvis W Contrast  02/03/2016  CLINICAL DATA:  Right lower quadrant abdominal pain and difficulty urinating since Tuesday morning. Fever and chills. EXAM: CT ABDOMEN AND PELVIS WITH CONTRAST TECHNIQUE: Multidetector CT imaging of the abdomen and pelvis was performed using the standard protocol following bolus administration of intravenous contrast. CONTRAST:  ISOVUE-300 IOPAMIDOL (ISOVUE-300) INJECTION 61% COMPARISON:  None. FINDINGS: Lower chest: Bibasilar dependent atelectasis. Moderate breathing motion artifact. No infiltrates or effusions. No worrisome pulmonary lesions. Suspect small pulmonary nodule in the left lower lobe on image number 1 which measures 5 mm. There is also a right middle lobe nodule measuring 5 mm on image 10. These could be subpleural lymph nodes. They are likely benign. The heart is normal in size. No pericardial effusion. The distal esophagus is grossly normal. Hepatobiliary: No focal hepatic lesions or intrahepatic biliary dilatation. The gallbladder is normal. No common bile duct dilatation. Pancreas: No mass, inflammation or duct dilatation. Spleen: Normal size.  No focal lesions. Adrenals/Urinary Tract: The adrenal glands and kidneys are unremarkable. No inflammatory changes, mass lesions or obstructive findings. A small upper pole left renal calculus is noted incidentally. No obstructing ureteral calculi or bladder calculi. Stomach/Bowel: The stomach and duodenum are unremarkable. Significant inflammation of the mid sigmoid colon consistent with acute diverticulitis. There is marked surrounding inflammation in the sigmoid mesocolon and also significant inflammation adjacent small bowel loops in  the pelvis. This is complicated diverticulitis due to a focal walled-off perforation containing air. No discrete abscess. The remainder of the colon is unremarkable. The terminal ileum and appendix are normal. Moderate sigmoid diverticulosis. Vascular/Lymphatic: Small scattered mesenteric and retroperitoneal lymph nodes but no mass or adenopathy. Age advanced atherosclerotic calcifications involving the aorta iliac arteries but no aneurysm or dissection. Other: The bladder, prostate gland and seminal vesicles are unremarkable. No pelvic mass, adenopathy or free pelvic fluid collections. No inguinal mass or adenopathy. No inguinal hernia. Musculoskeletal: No significant bony findings. IMPRESSION: 1. Acute and severe sigmoid diverticulitis complicated by a focal walled-off perforation within air collection measuring 3 cm but no discrete abscess. Significant inflammation in the sigmoid mesocolon and inflamed adjacent small bowel loops. 2. No other significant acute abdominal/pelvic findings. 3. Age advanced atherosclerotic calcifications involving the aorta and iliac arteries. 4. Lower lobe pulmonary nodules are likely benign. No follow-up needed if patient is low-risk (and has no known or suspected primary neoplasm). Non-contrast chest CT can be considered in 12 months if patient is high-risk. This recommendation follows the consensus statement: Guidelines for Management of Incidental Pulmonary Nodules Detected on CT Images:From the Fleischner Society 2017; published online before print (10.1148/[id number]). Electronically  Signed   By: Rudie MeyerP.  Gallerani M.D.   On: 02/03/2016 11:02    Assessment/Plan   Principal Problem:   Diverticulitis Active Problems:   EtOH dependence (HCC)   Tobacco abuse   Pulmonary nodules   Acute sigmoid diverticulitis. . CT scan reveals acute and severe sigmoid diverticulitis complicated by a focal walled-off perforation with air collection measuring 3 cm but no discrete  abscess.  -admit to Medical bed -Continue IV flagyl and Ancef started in ED -Ice chips only -analgesics, anti-emetics     Etoh dependence - 40oz beer / daily. Normal transaminases -CIWA  Tobacco abuse.  -RN to provide smoking cessation education -consider Nicoderm patch upon discharge (has taken in the past)  Pulmonary nodules (in smoker) - incidental findings on CTscan. Will need to be followed outpatient by his PCP.   Hypoalbuminemia. Albumin 3.0, may be reactive but given ETOH could also be nutritional   -pre-albumin                                     DVT prophylaxis:   Lovenox Code Status:   Full code  Family Communication:     None  Disposition Plan: Discharge home home in 2-3 days              Consults called:  None  Admission status:  Admit- Medical bed    Willette ClusterPaula Ellora Varnum NP [facility] Pager [phone]  If 7PM-7AM, please contact night-coverage [email or web address] Password [email or web address]  02/03/2016, 12:02 PM

## 2016-02-03 NOTE — Clinical Social Work Note (Signed)
CSW received consult for patient needing assistance with medication cost.  Case manager can assist with helping patient pay for his medications, CSW to sign off please reconsult if other social work needs arise.  Ervin KnackEric R. Talmadge Ganas, MSW, Theresia MajorsLCSWA [phone] 02/03/2016 6:36 PM

## 2016-02-03 NOTE — ED Notes (Signed)
Attempted report 

## 2016-02-03 NOTE — ED Notes (Signed)
Pt requested more ice chips. Notified that after CT scan we will reevaluate about giving more ice chips.

## 2016-02-03 NOTE — Progress Notes (Signed)
Pharmacy Antibiotic Note  Darrell RecordMichael [name] is a 45 y.o. male admitted on 02/03/2016 with diverticulitis.  Pharmacy has been consulted for Ciprofloxacin dosing.  Plan: Cipro 400mg  IV q12 Pharmacy will sign-off as pt's renal function wnl and adjustments not anticipated.     Temp (24hrs), Avg:99 F (37.2 C), Min:98.4 F (36.9 C), Max:99.3 F (37.4 C)   Recent Labs Lab 02/03/16 0857  WBC 16.6*  CREATININE 1.14    CrCl cannot be calculated (Unknown ideal weight.).    No Known Allergies   Thank you for allowing pharmacy to be a part of this patient's care.  Marisue HumbleKendra Brittain Hosie, PharmD Clinical Pharmacist [facility]

## 2016-02-03 NOTE — ED Notes (Signed)
Pt given ice chips

## 2016-02-04 DIAGNOSIS — K5720 Diverticulitis of large intestine with perforation and abscess without bleeding: Secondary | ICD-10-CM

## 2016-02-04 DIAGNOSIS — R918 Other nonspecific abnormal finding of lung field: Secondary | ICD-10-CM

## 2016-02-04 DIAGNOSIS — Z720 Tobacco use: Secondary | ICD-10-CM

## 2016-02-04 DIAGNOSIS — F1020 Alcohol dependence, uncomplicated: Secondary | ICD-10-CM

## 2016-02-04 LAB — CBC
HEMATOCRIT: 40.7 % (ref 39.0–52.0)
Hemoglobin: 13.2 g/dL (ref 13.0–17.0)
MCH: 29.7 pg (ref 26.0–34.0)
MCHC: 32.4 g/dL (ref 30.0–36.0)
MCV: 91.5 fL (ref 78.0–100.0)
Platelets: 176 10*3/uL (ref 150–400)
RBC: 4.45 MIL/uL (ref 4.22–5.81)
RDW: 13.6 % (ref 11.5–15.5)
WBC: 11.7 10*3/uL — ABNORMAL HIGH (ref 4.0–10.5)

## 2016-02-04 LAB — BASIC METABOLIC PANEL
Anion gap: 5 (ref 5–15)
BUN: 6 mg/dL (ref 6–20)
CO2: 25 mmol/L (ref 22–32)
Calcium: 8 mg/dL — ABNORMAL LOW (ref 8.9–10.3)
Chloride: 106 mmol/L (ref 101–111)
Creatinine, Ser: 0.91 mg/dL (ref 0.61–1.24)
GFR calc Af Amer: >60 mL/min (ref >60)
GFR calc non Af Amer: >60 mL/min (ref >60)
GLUCOSE: 84 mg/dL (ref 65–99)
POTASSIUM: 3.8 mmol/L (ref 3.5–5.1)
Sodium: 136 mmol/L (ref 135–145)

## 2016-02-04 MED ORDER — PIPERACILLIN-TAZOBACTAM 3.375 G IVPB
3.3750 g | Freq: Three times a day (TID) | INTRAVENOUS | Status: DC
  Administered 2016-02-04 – 2016-02-06 (×7): 3.375 g via INTRAVENOUS
  Filled 2016-02-04 (×9): qty 50

## 2016-02-04 NOTE — Progress Notes (Signed)
PROGRESS NOTE  Darrell Hawkins:[id number] DOB: 06-26-1971 DOA: 02/03/2016 PCP: No PCP Per Patient  Brief History:  45 year old male with a history of alcohol dependence and tobacco abuse presented with two-day history of worsening abdominal pain. His abdominal pain began in the early morning of 02/01/2016. The patient continued to go to work and perform his daily activities. However he continued to have worsening abdominal pain.  He stated that ambulation and weightbearing increases abdominal pain. He denied any fevers, chills, chest pain, shortness breath, vomiting, hematochezia, melena. Workup in the emergency department including CT of the abdomen and pelvis showed inflammation of the mid sigmoid colon with inflamed sigmoid nasal colitis and adjacent bowel loops. There is a wall perforation up to 3 cm.  The patient was started on Cipro and Flagyl. General surgery was consulted.  Assessment/Plan: Acute diverticulitis -cipro/Flagyl-->zosyn per surgery -remain npo -continue IVF -pain control--IV dilaudid q 3 hours prn pain -appreciate general surgery consult -WBC improving -am CBC/BMP  Alcohol dependence -CIWA  Tobacco abuse -defers nicotine patch -cessation discussed  Pulmonary nodules -incidental finding on CT -outpt surveillance   Disposition Plan:   Home in 2-3 days  Family Communication:   Daughter updated at bedside 7/20  Consultants:  General surgery  Code Status:  FULL  DVT Prophylaxis:  [place] Lovenox   Procedures: As Listed in Progress Note Above  Antibiotics: Cipro/flagyl 02/03/16>>02/04/16 Zosyn 02/04/16>>>    Subjective: Patient states that his abdominal pain is somewhat improved from yesterday. He denies any fevers, chills, chest pain, breath, vomiting, diarrhea, and occasional melena. Denies any dysuria or hematuria.  Objective: Filed Vitals:   02/03/16 1355 02/03/16 2045 02/04/16 0505 02/04/16 1422  BP: 132/91 111/67 96/55 112/67    Pulse: 94 88 82 79  Temp: 99.3 F (37.4 C) 99.1 F (37.3 C) 98.8 F (37.1 C) 98.7 F (37.1 C)  TempSrc: Oral Oral Oral Oral  Resp: 16 16 16 16   SpO2: 98% 95% 95% 94%    Intake/Output Summary (Last 24 hours) at 02/04/16 1725 Last data filed at 02/04/16 1335  Gross per 24 hour  Intake 1998.33 ml  Output    500 ml  Net 1498.33 ml   Weight change:  Exam:   General:  Pt is alert, follows commands appropriately, not in acute distress  HEENT: No icterus, No thrush, No neck mass, [place]/AT  Cardiovascular: RRR, S1/S2, no rubs, no gallops  Respiratory: Diminished breath sounds at the bases but clear to auscultation  Abdomen: Soft/+BS, LLQ and suprapubic tender without rebound, non distended, no guarding  Extremities: No edema, No lymphangitis, No petechiae, No rashes, no synovitis   Data Reviewed: I have personally reviewed following labs and imaging studies Basic Metabolic Panel:  Recent Labs Lab 02/03/16 0857 02/04/16 0507  NA 135 136  K 4.4 3.8  CL 105 106  CO2 24 25  GLUCOSE 90 84  BUN 7 6  CREATININE 1.14 0.91  CALCIUM 8.7* 8.0*   Liver Function Tests:  Recent Labs Lab 02/03/16 0857  AST 20  ALT 12*  ALKPHOS 68  BILITOT 1.5*  PROT 5.7*  ALBUMIN 3.0*    Recent Labs Lab 02/03/16 0857  LIPASE 18   No results for input(s): AMMONIA in the last 168 hours. Coagulation Profile: No results for input(s): INR, PROTIME in the last 168 hours. CBC:  Recent Labs Lab 02/03/16 0857 02/04/16 0507  WBC 16.6* 11.7*  NEUTROABS 13.8*  --   HGB  16.5 13.2  HCT 49.4 40.7  MCV 90.6 91.5  PLT 191 176   Cardiac Enzymes: No results for input(s): CKTOTAL, CKMB, CKMBINDEX, TROPONINI in the last 168 hours. BNP: Invalid input(s): POCBNP CBG: No results for input(s): GLUCAP in the last 168 hours. HbA1C: No results for input(s): HGBA1C in the last 72 hours. Urine analysis:    Component Value Date/Time   COLORURINE AMBER* 02/03/2016 0937   APPEARANCEUR CLOUDY*  02/03/2016 0937   LABSPEC 1.017 02/03/2016 0937   PHURINE 5.5 02/03/2016 0937   GLUCOSEU 100* 02/03/2016 0937   HGBUR TRACE* 02/03/2016 0937   BILIRUBINUR NEGATIVE 02/03/2016 0937   KETONESUR NEGATIVE 02/03/2016 0937   PROTEINUR 30* 02/03/2016 0937   NITRITE NEGATIVE 02/03/2016 0937   LEUKOCYTESUR NEGATIVE 02/03/2016 0937   Sepsis Labs: (procalcitonin:4,lacticidven:4) )No results found for this or any previous visit (from the past 240 hour(s)).   Scheduled Meds: . antiseptic oral rinse  7 mL Mouth Rinse q12n4p  . chlorhexidine  15 mL Mouth Rinse BID  . enoxaparin (LOVENOX) injection  40 mg Subcutaneous Q24H  . folic acid  1 mg Oral Daily  . multivitamin with minerals  1 tablet Oral Daily  . piperacillin-tazobactam (ZOSYN)  IV  3.375 g Intravenous Q8H  . thiamine  100 mg Oral Daily   Or  . thiamine  100 mg Intravenous Daily   Continuous Infusions: . sodium chloride 100 mL/hr at 02/03/16 2348    Procedures/Studies: Ct Abdomen Pelvis W Contrast  02/03/2016  CLINICAL DATA:  Right lower quadrant abdominal pain and difficulty urinating since Tuesday morning. Fever and chills. EXAM: CT ABDOMEN AND PELVIS WITH CONTRAST TECHNIQUE: Multidetector CT imaging of the abdomen and pelvis was performed using the standard protocol following bolus administration of intravenous contrast. CONTRAST:  ISOVUE-300 IOPAMIDOL (ISOVUE-300) INJECTION 61% COMPARISON:  None. FINDINGS: Lower chest: Bibasilar dependent atelectasis. Moderate breathing motion artifact. No infiltrates or effusions. No worrisome pulmonary lesions. Suspect small pulmonary nodule in the left lower lobe on image number 1 which measures 5 mm. There is also a right middle lobe nodule measuring 5 mm on image 10. These could be subpleural lymph nodes. They are likely benign. The heart is normal in size. No pericardial effusion. The distal esophagus is grossly normal. Hepatobiliary: No focal hepatic lesions or intrahepatic  biliary dilatation. The gallbladder is normal. No common bile duct dilatation. Pancreas: No mass, inflammation or duct dilatation. Spleen: Normal size.  No focal lesions. Adrenals/Urinary Tract: The adrenal glands and kidneys are unremarkable. No inflammatory changes, mass lesions or obstructive findings. A small upper pole left renal calculus is noted incidentally. No obstructing ureteral calculi or bladder calculi. Stomach/Bowel: The stomach and duodenum are unremarkable. Significant inflammation of the mid sigmoid colon consistent with acute diverticulitis. There is marked surrounding inflammation in the sigmoid mesocolon and also significant inflammation adjacent small bowel loops in the pelvis. This is complicated diverticulitis due to a focal walled-off perforation containing air. No discrete abscess. The remainder of the colon is unremarkable. The terminal ileum and appendix are normal. Moderate sigmoid diverticulosis. Vascular/Lymphatic: Small scattered mesenteric and retroperitoneal lymph nodes but no mass or adenopathy. Age advanced atherosclerotic calcifications involving the aorta iliac arteries but no aneurysm or dissection. Other: The bladder, prostate gland and seminal vesicles are unremarkable. No pelvic mass, adenopathy or free pelvic fluid collections. No inguinal mass or adenopathy. No inguinal hernia. Musculoskeletal: No significant bony findings. IMPRESSION: 1. Acute and severe sigmoid diverticulitis complicated by a focal walled-off perforation within air collection measuring  3 cm but no discrete abscess. Significant inflammation in the sigmoid mesocolon and inflamed adjacent small bowel loops. 2. No other significant acute abdominal/pelvic findings. 3. Age advanced atherosclerotic calcifications involving the aorta and iliac arteries. 4. Lower lobe pulmonary nodules are likely benign. No follow-up needed if patient is low-risk (and has no known or suspected primary neoplasm). Non-contrast  chest CT can be considered in 12 months if patient is high-risk. This recommendation follows the consensus statement: Guidelines for Management of Incidental Pulmonary Nodules Detected on CT Images:From the Fleischner Society 2017; published online before print (10.1148/[id number]). Electronically Signed   By: Rudie MeyerP.  Gallerani M.D.   On: 02/03/2016 11:02    Jovin Fester, DO  [facility] Pager [phone]  If 7PM-7AM, please contact night-coverage [email or web address] Password [email or web address] 02/04/2016, 5:25 PM   LOS: 1 day

## 2016-02-04 NOTE — Progress Notes (Signed)
Pharmacy Antibiotic Note  Darrell RecordMichael [name] is a 45 y.o. male admitted on 02/03/2016 with diverticulitis.  Pharmacy has been consulted for Zosyn dosing. Previously on cipro. Afeb, wbc down 11.7, SCr improved 0.91. No cultures.  Plan: Zosyn 3.375g IV q8h (4h inf) Monitor clinical progress, c/s, renal function, abx plan/LOT    Temp (24hrs), Avg:99.1 F (37.3 C), Min:98.8 F (37.1 C), Max:99.3 F (37.4 C)   Recent Labs Lab 02/03/16 0857 02/04/16 0507  WBC 16.6* 11.7*  CREATININE 1.14 0.91    CrCl cannot be calculated (Unknown ideal weight.).    No Known Allergies   Babs BertinHaley Robyn Galati, PharmD, [facility] Clinical Pharmacist Pager [phone] 02/04/2016 11:01 AM

## 2016-02-04 NOTE — Consult Note (Signed)
[facility] Surgery Consult Note  Darrell Hawkins May 25, 1971  [id number].    Requesting MD: Dr. Carles Collet Chief Complaint/Reason for Consult: sigmoid diverticulitis HPI:  45 year old man with PMH Depression, EtOH and tobacco abuse who presented to [facility] on 7/19 with abdominal pain that began on 7/17 and got gradually worse. Pain is in his lower abdomen and worse with urination and defecation. Associated with chills. CT scan revealed sigmoid diverticulitis with a 3 cm, focal, walled-off perforation and no abscess formation. General surgery was asked to see the patient in consult. No surgical history. NKDA.  Today his girlfriend is in the room. His pain is improved from yesterday, able to urinate without pain, ambulating. Has only had ice chips by mouth. +flatus. Denies PMH diverticulitis. Denies CP, SOB, weakness.  ROS: All systems reviewed and otherwise negative except for as above  Family History  Problem Relation Age of Onset  . Lung cancer Mother     Past Medical History  Diagnosis Date  . Depression   . Diverticulitis hospitalized 02/03/2016  . Migraine     "once/year maybe" (02/03/2016)    Past Surgical History  Procedure Laterality Date  . No past surgeries      Social History:  reports that he has been smoking Cigarettes.  He has a 20 pack-year smoking history. He has quit using smokeless tobacco. His smokeless tobacco use included Chew. He reports that he drinks about 14.4 oz of alcohol per week. He reports that he uses illicit drugs (Marijuana).  Allergies: No Known Allergies  Medications Prior to Admission  Medication Sig Dispense Refill  . ibuprofen (ADVIL,MOTRIN) 200 MG tablet Take 800 mg by mouth every 6 (six) hours as needed for mild pain.    . traZODone (DESYREL) 100 MG tablet Take 1 tablet (100 mg total) by mouth at bedtime as needed for sleep (.). 30 tablet 0  . albuterol (PROVENTIL HFA;VENTOLIN HFA) 108 (90 BASE) MCG/ACT inhaler Inhale 2 puffs into the lungs  every 4 (four) hours as needed for wheezing. (Patient not taking: Reported on 05/19/2015) 1 Inhaler 0  . nicotine (NICODERM CQ - DOSED IN MG/24 HOURS) 21 mg/24hr patch Place 1 patch (21 mg total) onto the skin daily. (Patient not taking: Reported on 02/03/2016) 28 patch 0    Blood pressure 96/55, pulse 82, temperature 98.8 F (37.1 C), temperature source Oral, resp. rate 16, SpO2 95 %.  Physical Exam: General: pleasant white male who is laying in bed in NAD HEENT: head is normocephalic, atraumatic.  Sclera are noninjected.  Mouth is dry Heart: regular, rate, and rhythm.  No obvious murmurs, gallops, or rubs noted.  Palpable pedal pulses bilaterally Lungs: CTAB, no wheezes, rhonchi, or rales noted.  Respiratory effort nonlabored Abd: soft, TTP LLQ and RLQ, ND, +BS, no masses, hernias, or organomegaly MS: all 4 extremities are symmetrical with no cyanosis, clubbing, or edema. Skin: warm and dry with no masses, lesions, or rashes Psych: A&Ox3 with an appropriate affect. Neuro: normal speech  Results for orders placed or performed during the hospital encounter of 02/03/16 (from the past 48 hour(s))  Comprehensive metabolic panel     Status: Abnormal   Collection Time: 02/03/16  8:57 AM  Result Value Ref Range   Sodium 135 135 - 145 mmol/L   Potassium 4.4 3.5 - 5.1 mmol/L   Chloride 105 101 - 111 mmol/L   CO2 24 22 - 32 mmol/L   Glucose, Bld 90 65 - 99 mg/dL   BUN 7 6 -  20 mg/dL   Creatinine, Ser 1.14 0.61 - 1.24 mg/dL   Calcium 8.7 (L) 8.9 - 10.3 mg/dL   Total Protein 5.7 (L) 6.5 - 8.1 g/dL   Albumin 3.0 (L) 3.5 - 5.0 g/dL   AST 20 15 - 41 U/L   ALT 12 (L) 17 - 63 U/L   Alkaline Phosphatase 68 38 - 126 U/L   Total Bilirubin 1.5 (H) 0.3 - 1.2 mg/dL   GFR calc non Af Amer >60 >60 mL/min   GFR calc Af Amer >60 >60 mL/min    Comment: (NOTE) The eGFR has been calculated using the CKD EPI equation. This calculation has not been validated in all clinical situations. eGFR's persistently  <60 mL/min signify possible Chronic Kidney Disease.    Anion gap 6 5 - 15  Lipase, blood     Status: None   Collection Time: 02/03/16  8:57 AM  Result Value Ref Range   Lipase 18 11 - 51 U/L  CBC with Differential     Status: Abnormal   Collection Time: 02/03/16  8:57 AM  Result Value Ref Range   WBC 16.6 (H) 4.0 - 10.5 K/uL   RBC 5.45 4.22 - 5.81 MIL/uL   Hemoglobin 16.5 13.0 - 17.0 g/dL   HCT 49.4 39.0 - 52.0 %   MCV 90.6 78.0 - 100.0 fL   MCH 30.3 26.0 - 34.0 pg   MCHC 33.4 30.0 - 36.0 g/dL   RDW 13.5 11.5 - 15.5 %   Platelets 191 150 - 400 K/uL   Neutrophils Relative % 83 %   Neutro Abs 13.8 (H) 1.7 - 7.7 K/uL   Lymphocytes Relative 8 %   Lymphs Abs 1.3 0.7 - 4.0 K/uL   Monocytes Relative 9 %   Monocytes Absolute 1.6 (H) 0.1 - 1.0 K/uL   Eosinophils Relative 0 %   Eosinophils Absolute 0.0 0.0 - 0.7 K/uL   Basophils Relative 0 %   Basophils Absolute 0.0 0.0 - 0.1 K/uL  Urinalysis, Routine w reflex microscopic     Status: Abnormal   Collection Time: 02/03/16  9:37 AM  Result Value Ref Range   Color, Urine AMBER (A) YELLOW    Comment: BIOCHEMICALS MAY BE AFFECTED BY COLOR   APPearance CLOUDY (A) CLEAR   Specific Gravity, Urine 1.017 1.005 - 1.030   pH 5.5 5.0 - 8.0   Glucose, UA 100 (A) NEGATIVE mg/dL   Hgb urine dipstick TRACE (A) NEGATIVE   Bilirubin Urine NEGATIVE NEGATIVE   Ketones, ur NEGATIVE NEGATIVE mg/dL   Protein, ur 30 (A) NEGATIVE mg/dL   Nitrite NEGATIVE NEGATIVE   Leukocytes, UA NEGATIVE NEGATIVE  Urine microscopic-add on     Status: Abnormal   Collection Time: 02/03/16  9:37 AM  Result Value Ref Range   Squamous Epithelial / LPF 0-5 (A) NONE SEEN   WBC, UA 6-30 0 - 5 WBC/hpf   RBC / HPF NONE SEEN 0 - 5 RBC/hpf   Bacteria, UA FEW (A) NONE SEEN   Casts WBC CAST (A) NEGATIVE    Comment: GRANULAR CAST  Prealbumin     Status: Abnormal   Collection Time: 02/03/16 12:31 PM  Result Value Ref Range   Prealbumin 9.3 (L) 18 - 38 mg/dL  Basic metabolic  panel     Status: Abnormal   Collection Time: 02/04/16  5:07 AM  Result Value Ref Range   Sodium 136 135 - 145 mmol/L   Potassium 3.8 3.5 - 5.1 mmol/L   Chloride  106 101 - 111 mmol/L   CO2 25 22 - 32 mmol/L   Glucose, Bld 84 65 - 99 mg/dL   BUN 6 6 - 20 mg/dL   Creatinine, Ser 0.91 0.61 - 1.24 mg/dL   Calcium 8.0 (L) 8.9 - 10.3 mg/dL   GFR calc non Af Amer >60 >60 mL/min   GFR calc Af Amer >60 >60 mL/min    Comment: (NOTE) The eGFR has been calculated using the CKD EPI equation. This calculation has not been validated in all clinical situations. eGFR's persistently <60 mL/min signify possible Chronic Kidney Disease.    Anion gap 5 5 - 15  CBC     Status: Abnormal   Collection Time: 02/04/16  5:07 AM  Result Value Ref Range   WBC 11.7 (H) 4.0 - 10.5 K/uL   RBC 4.45 4.22 - 5.81 MIL/uL   Hemoglobin 13.2 13.0 - 17.0 g/dL   HCT 40.7 39.0 - 52.0 %   MCV 91.5 78.0 - 100.0 fL   MCH 29.7 26.0 - 34.0 pg   MCHC 32.4 30.0 - 36.0 g/dL   RDW 13.6 11.5 - 15.5 %   Platelets 176 150 - 400 K/uL   Ct Abdomen Pelvis W Contrast  02/03/2016  CLINICAL DATA:  Right lower quadrant abdominal pain and difficulty urinating since Tuesday morning. Fever and chills. EXAM: CT ABDOMEN AND PELVIS WITH CONTRAST TECHNIQUE: Multidetector CT imaging of the abdomen and pelvis was performed using the standard protocol following bolus administration of intravenous contrast. CONTRAST:  136m ISOVUE-300 IOPAMIDOL (ISOVUE-300) INJECTION 61% COMPARISON:  None. FINDINGS: Lower chest: Bibasilar dependent atelectasis. Moderate breathing motion artifact. No infiltrates or effusions. No worrisome pulmonary lesions. Suspect small pulmonary nodule in the left lower lobe on image number 1 which measures 5 mm. There is also a right middle lobe nodule measuring 5 mm on image 10. These could be subpleural lymph nodes. They are likely benign. The heart is normal in size. No pericardial effusion. The distal esophagus is grossly normal.  Hepatobiliary: No focal hepatic lesions or intrahepatic biliary dilatation. The gallbladder is normal. No common bile duct dilatation. Pancreas: No mass, inflammation or duct dilatation. Spleen: Normal size.  No focal lesions. Adrenals/Urinary Tract: The adrenal glands and kidneys are unremarkable. No inflammatory changes, mass lesions or obstructive findings. A small upper pole left renal calculus is noted incidentally. No obstructing ureteral calculi or bladder calculi. Stomach/Bowel: The stomach and duodenum are unremarkable. Significant inflammation of the mid sigmoid colon consistent with acute diverticulitis. There is marked surrounding inflammation in the sigmoid mesocolon and also significant inflammation adjacent small bowel loops in the pelvis. This is complicated diverticulitis due to a focal walled-off perforation containing air. No discrete abscess. The remainder of the colon is unremarkable. The terminal ileum and appendix are normal. Moderate sigmoid diverticulosis. Vascular/Lymphatic: Small scattered mesenteric and retroperitoneal lymph nodes but no mass or adenopathy. Age advanced atherosclerotic calcifications involving the aorta iliac arteries but no aneurysm or dissection. Other: The bladder, prostate gland and seminal vesicles are unremarkable. No pelvic mass, adenopathy or free pelvic fluid collections. No inguinal mass or adenopathy. No inguinal hernia. Musculoskeletal: No significant bony findings. IMPRESSION: 1. Acute and severe sigmoid diverticulitis complicated by a focal walled-off perforation within air collection measuring 3 cm but no discrete abscess. Significant inflammation in the sigmoid mesocolon and inflamed adjacent small bowel loops. 2. No other significant acute abdominal/pelvic findings. 3. Age advanced atherosclerotic calcifications involving the aorta and iliac arteries. 4. Lower lobe pulmonary nodules are likely  benign. No follow-up needed if patient is low-risk (and has no  known or suspected primary neoplasm). Non-contrast chest CT can be considered in 12 months if patient is high-risk. This recommendation follows the consensus statement: Guidelines for Management of Incidental Pulmonary Nodules Detected on CT Images:From the Fleischner Society 2017; published online before print (10.1148/[id number]). Electronically Signed   By: Marijo Sanes M.D.   On: 02/03/2016 11:02   Assessment/Plan Acute Diverticulitis of Sigmoid - WBC 11.7 (16.6), VSS, tender but clinically improving  - currently on cipro/flagyl   EtOH dependence Tobacco abuse Pulmonary Nodules Hypoalbuminemia  FEN: NPO, ice chips DVT Proph: Lovenox ID: Cipro/Flagyl Day #2 Plan: continue non-surgical management with NPO and IV abx. CBC in AM. If pain continues to decrease and WBC trending down, may advance diet to clears tomorrow. Appreciate the consult.  Jill Alexanders, [facility] Surgery 02/04/2016, 9:33 AM Pager: [phone] Consults: [phone] Mon-Fri 7:00 am-4:30 pm Sat-Sun 7:00 am-11:30 am

## 2016-02-05 DIAGNOSIS — K5700 Diverticulitis of small intestine with perforation and abscess without bleeding: Secondary | ICD-10-CM

## 2016-02-05 LAB — CBC
HEMATOCRIT: 43.9 % (ref 39.0–52.0)
HEMOGLOBIN: 14.6 g/dL (ref 13.0–17.0)
MCH: 30 pg (ref 26.0–34.0)
MCHC: 33.3 g/dL (ref 30.0–36.0)
MCV: 90.1 fL (ref 78.0–100.0)
Platelets: 179 10*3/uL (ref 150–400)
RBC: 4.87 MIL/uL (ref 4.22–5.81)
RDW: 12.9 % (ref 11.5–15.5)
WBC: 9.7 10*3/uL (ref 4.0–10.5)

## 2016-02-05 NOTE — Progress Notes (Signed)
PROGRESS NOTE  Darrell Hawkins AVW:[id number] DOB: 24-Feb-1971 DOA: 02/03/2016 PCP: No PCP Per Patient  Brief History:  45 year old male with a history of alcohol dependence and tobacco abuse presented with two-day history of worsening abdominal pain. His abdominal pain began in the early morning of 02/01/2016. The patient continued to go to work and perform his daily activities. However he continued to have worsening abdominal pain. He stated that ambulation and weightbearing increases abdominal pain. He denied any fevers, chills, chest pain, shortness breath, vomiting, hematochezia, melena. Workup in the emergency department including CT of the abdomen and pelvis showed inflammation of the mid sigmoid colon with inflamed sigmoid nasal colitis and adjacent bowel loops. There is a wall perforation up to 3 cm. The patient was started on Cipro and Flagyl. General surgery was consulted.  Assessment/Plan: Acute diverticulitis -cipro/Flagyl-->zosyn per surgery -Advance diet to full liquids -continue IVF -pain control--D/C IV dilaudid--start po opioid -appreciate general surgery consult -WBC improving -am CBC/BMP  Alcohol dependence -CIWA -no signs of withdrawl  Tobacco abuse -defers nicotine patch -cessation discussed  Pulmonary nodules -incidental finding on CT -outpt surveillance   Disposition Plan: Home in 2-3 days  Family Communication: Daughter updated at bedside 7/20  Consultants: General surgery  Code Status: FULL  DVT Prophylaxis: [place] Lovenox   Procedures: As Listed in Progress Note Above  Antibiotics: Cipro/flagyl 02/03/16>>02/04/16 Zosyn 02/04/16>>>   Subjective: Patient denies fevers, chills, headache, chest pain, dyspnea, nausea, vomiting, diarrhea, abdominal pain, dysuria, hematuria, hematochezia, and melena. Abdominal pain is feeling better but still having occasional although minimal episodes in the left lower quadrant. No radiation or  worsening with movement.   Objective: Filed Vitals:   02/04/16 2207 02/05/16 0632 02/05/16 1150 02/05/16 1315  BP: 112/61 127/71 121/82 114/62  Pulse: 90 95 93 80  Temp: 99.8 F (37.7 C) 98.7 F (37.1 C) 99.3 F (37.4 C) 98.8 F (37.1 C)  TempSrc: Oral Oral Oral Oral  Resp:  18 20 20   SpO2: 92% 94% 95% 96%    Intake/Output Summary (Last 24 hours) at 02/05/16 1450 Last data filed at 02/05/16 1350  Gross per 24 hour  Intake   2575 ml  Output    945 ml  Net   1630 ml   Weight change:  Exam:   General:  Pt is alert, follows commands appropriately, not in acute distress  HEENT: No icterus, No thrush, No neck mass, [place]/AT  Cardiovascular: RRR, S1/S2, no rubs, no gallops  Respiratory: CTA bilaterally, no wheezing, no crackles, no rhonchi  Abdomen: Soft/+BS, non tender, non distended, no guarding  Extremities: No edema, No lymphangitis, No petechiae, No rashes, no synovitis   Data Reviewed: I have personally reviewed following labs and imaging studies Basic Metabolic Panel:  Recent Labs Lab 02/03/16 0857 02/04/16 0507  NA 135 136  K 4.4 3.8  CL 105 106  CO2 24 25  GLUCOSE 90 84  BUN 7 6  CREATININE 1.14 0.91  CALCIUM 8.7* 8.0*   Liver Function Tests:  Recent Labs Lab 02/03/16 0857  AST 20  ALT 12*  ALKPHOS 68  BILITOT 1.5*  PROT 5.7*  ALBUMIN 3.0*    Recent Labs Lab 02/03/16 0857  LIPASE 18   No results for input(s): AMMONIA in the last 168 hours. Coagulation Profile: No results for input(s): INR, PROTIME in the last 168 hours. CBC:  Recent Labs Lab 02/03/16 0857 02/04/16 0507 02/05/16 0831  WBC 16.6* 11.7* 9.7  NEUTROABS 13.8*  --   --   HGB 16.5 13.2 14.6  HCT 49.4 40.7 43.9  MCV 90.6 91.5 90.1  PLT 191 176 179   Cardiac Enzymes: No results for input(s): CKTOTAL, CKMB, CKMBINDEX, TROPONINI in the last 168 hours. BNP: Invalid input(s): POCBNP CBG: No results for input(s): GLUCAP in the last 168 hours. HbA1C: No results for  input(s): HGBA1C in the last 72 hours. Urine analysis:    Component Value Date/Time   COLORURINE AMBER* 02/03/2016 0937   APPEARANCEUR CLOUDY* 02/03/2016 0937   LABSPEC 1.017 02/03/2016 0937   PHURINE 5.5 02/03/2016 0937   GLUCOSEU 100* 02/03/2016 0937   HGBUR TRACE* 02/03/2016 0937   BILIRUBINUR NEGATIVE 02/03/2016 0937   KETONESUR NEGATIVE 02/03/2016 0937   PROTEINUR 30* 02/03/2016 0937   NITRITE NEGATIVE 02/03/2016 0937   LEUKOCYTESUR NEGATIVE 02/03/2016 0937   Sepsis Labs: @LABRCNTIP (procalcitonin:4,lacticidven:4) )No results found for this or any previous visit (from the past 240 hour(s)).   Scheduled Meds: . antiseptic oral rinse  7 mL Mouth Rinse q12n4p  . chlorhexidine  15 mL Mouth Rinse BID  . enoxaparin (LOVENOX) injection  40 mg Subcutaneous Q24H  . folic acid  1 mg Oral Daily  . multivitamin with minerals  1 tablet Oral Daily  . piperacillin-tazobactam (ZOSYN)  IV  3.375 g Intravenous Q8H  . thiamine  100 mg Oral Daily   Or  . thiamine  100 mg Intravenous Daily   Continuous Infusions: . sodium chloride 50 mL/hr at 02/05/16 0831    Procedures/Studies: Ct Abdomen Pelvis W Contrast  02/03/2016  CLINICAL DATA:  Right lower quadrant abdominal pain and difficulty urinating since Tuesday morning. Fever and chills. EXAM: CT ABDOMEN AND PELVIS WITH CONTRAST TECHNIQUE: Multidetector CT imaging of the abdomen and pelvis was performed using the standard protocol following bolus administration of intravenous contrast. CONTRAST:  ISOVUE-300 IOPAMIDOL (ISOVUE-300) INJECTION 61% COMPARISON:  None. FINDINGS: Lower chest: Bibasilar dependent atelectasis. Moderate breathing motion artifact. No infiltrates or effusions. No worrisome pulmonary lesions. Suspect small pulmonary nodule in the left lower lobe on image number 1 which measures 5 mm. There is also a right middle lobe nodule measuring 5 mm on image 10. These could be subpleural lymph nodes. They are likely benign. The heart  is normal in size. No pericardial effusion. The distal esophagus is grossly normal. Hepatobiliary: No focal hepatic lesions or intrahepatic biliary dilatation. The gallbladder is normal. No common bile duct dilatation. Pancreas: No mass, inflammation or duct dilatation. Spleen: Normal size.  No focal lesions. Adrenals/Urinary Tract: The adrenal glands and kidneys are unremarkable. No inflammatory changes, mass lesions or obstructive findings. A small upper pole left renal calculus is noted incidentally. No obstructing ureteral calculi or bladder calculi. Stomach/Bowel: The stomach and duodenum are unremarkable. Significant inflammation of the mid sigmoid colon consistent with acute diverticulitis. There is marked surrounding inflammation in the sigmoid mesocolon and also significant inflammation adjacent small bowel loops in the pelvis. This is complicated diverticulitis due to a focal walled-off perforation containing air. No discrete abscess. The remainder of the colon is unremarkable. The terminal ileum and appendix are normal. Moderate sigmoid diverticulosis. Vascular/Lymphatic: Small scattered mesenteric and retroperitoneal lymph nodes but no mass or adenopathy. Age advanced atherosclerotic calcifications involving the aorta iliac arteries but no aneurysm or dissection. Other: The bladder, prostate gland and seminal vesicles are unremarkable. No pelvic mass, adenopathy or free pelvic fluid collections. No inguinal mass or adenopathy. No inguinal hernia. Musculoskeletal: No significant bony findings. IMPRESSION: 1. Acute  and severe sigmoid diverticulitis complicated by a focal walled-off perforation within air collection measuring 3 cm but no discrete abscess. Significant inflammation in the sigmoid mesocolon and inflamed adjacent small bowel loops. 2. No other significant acute abdominal/pelvic findings. 3. Age advanced atherosclerotic calcifications involving the aorta and iliac arteries. 4. Lower lobe  pulmonary nodules are likely benign. No follow-up needed if patient is low-risk (and has no known or suspected primary neoplasm). Non-contrast chest CT can be considered in 12 months if patient is high-risk. This recommendation follows the consensus statement: Guidelines for Management of Incidental Pulmonary Nodules Detected on CT Images:From the Fleischner Society 2017; published online before print (10.1148/[id number]). Electronically Signed   By: Rudie Meyer M.D.   On: 02/03/2016 11:02    Ladan Vanderzanden, DO  [facility] Pager [phone]  If 7PM-7AM, please contact night-coverage [email or web address] Password [email or web address] 02/05/2016, 2:50 PM   LOS: 2 days

## 2016-02-05 NOTE — Care Management Note (Signed)
Case Management Note  Patient Details  Name: Darrell RecordMichael [name] MRN: [id number] Date of Birth: [birth year]  Subjective/Objective:                    Action/Plan:  Patient for possible discharge tomorrow. Confirmed with patient he has no insurance or PCP .   Provided MATCH letter and explained including pain medication is not covered.  Provided [facility] information .  Patient voiced understanding to all of above.  Expected Discharge Date:                  Expected Discharge Plan:  Home/Self Care  In-House Referral:     Discharge planning Services  CM Consult, Medication Assistance, [facility]igent [facility], [facility]  Post Acute Care Choice:    Choice offered to:  Patient  DME Arranged:    DME Agency:     HH Arranged:    HH Agency:     Status of Service:  Completed, signed off  If discussed at [employer] Meetings, dates discussed:    Additional Comments:  Kingsley PlanWile, Darrell Lineman Marie, Darrell Hawkins 02/05/2016, 10:50 AM

## 2016-02-05 NOTE — Progress Notes (Signed)
Subjective: Abdominal pain improved. Denies nausea, vomiting and fever. Passing flatus. Had BM. No dysuria or urinary retention. VSS.  WBC 11.7 on 02/04/16. Waiting for CBC today.   Objective: Vital signs in last 24 hours: Temp:  [98.7 F (37.1 C)-99.8 F (37.7 C)] 98.7 F (37.1 C) (07/21 [id number]) Pulse Rate:  [79-95] 95 (07/21 0632) Resp:  [16-18] 18 (07/21 [id number]) BP: (112-127)/(61-71) 127/71 mmHg (07/21 0632) SpO2:  [92 %-94 %] 94 % (07/21 [id number]) Last BM Date: 02/05/16  Intake/Output from previous day: 07/20 0701 - 07/21 0700 In: 1377 [P.O.:60; I.V.:1317] Out: 420 [Urine:420] Intake/Output this shift: Total I/O In: 60 [P.O.:60] Out: -   General appearance  Awake and talkative.  Head: Normocephalic/atraumatic. PERRL bilaterally. Extraocular eye muscles in tact.  Neck: FROM Cardiovascular: Regular rate and rhythm without gallops, murmurs or rubs. Respiratory: Lung sounds vesicular. No wheezes, crackles or rhonchi auscultated. Abdomen: Soft and non tender without guarding, masses, or rigidity  Lab Results:   Recent Labs  02/03/16 0857 02/04/16 0507  WBC 16.6* 11.7*  HGB 16.5 13.2  HCT 49.4 40.7  PLT 191 176   BMET  Recent Labs  02/03/16 0857 02/04/16 0507  NA 135 136  K 4.4 3.8  CL 105 106  CO2 24 25  GLUCOSE 90 84  BUN 7 6  CREATININE 1.14 0.91  CALCIUM 8.7* 8.0*   PT/INR No results for input(s): LABPROT, INR in the last 72 hours. ABG No results for input(s): PHART, HCO3 in the last 72 hours.  Invalid input(s): PCO2, PO2  Studies/Results: Ct Abdomen Pelvis W Contrast  02/03/2016  CLINICAL DATA:  Right lower quadrant abdominal pain and difficulty urinating since Tuesday morning. Fever and chills. EXAM: CT ABDOMEN AND PELVIS WITH CONTRAST TECHNIQUE: Multidetector CT imaging of the abdomen and pelvis was performed using the standard protocol following bolus administration of intravenous contrast. CONTRAST:  100mL ISOVUE-300 IOPAMIDOL (ISOVUE-300) INJECTION  61% COMPARISON:  None. FINDINGS: Lower chest: Bibasilar dependent atelectasis. Moderate breathing motion artifact. No infiltrates or effusions. No worrisome pulmonary lesions. Suspect small pulmonary nodule in the left lower lobe on image number 1 which measures 5 mm. There is also a right middle lobe nodule measuring 5 mm on image 10. These could be subpleural lymph nodes. They are likely benign. The heart is normal in size. No pericardial effusion. The distal esophagus is grossly normal. Hepatobiliary: No focal hepatic lesions or intrahepatic biliary dilatation. The gallbladder is normal. No common bile duct dilatation. Pancreas: No mass, inflammation or duct dilatation. Spleen: Normal size.  No focal lesions. Adrenals/Urinary Tract: The adrenal glands and kidneys are unremarkable. No inflammatory changes, mass lesions or obstructive findings. A small upper pole left renal calculus is noted incidentally. No obstructing ureteral calculi or bladder calculi. Stomach/Bowel: The stomach and duodenum are unremarkable. Significant inflammation of the mid sigmoid colon consistent with acute diverticulitis. There is marked surrounding inflammation in the sigmoid mesocolon and also significant inflammation adjacent small bowel loops in the pelvis. This is complicated diverticulitis due to a focal walled-off perforation containing air. No discrete abscess. The remainder of the colon is unremarkable. The terminal ileum and appendix are normal. Moderate sigmoid diverticulosis. Vascular/Lymphatic: Small scattered mesenteric and retroperitoneal lymph nodes but no mass or adenopathy. Age advanced atherosclerotic calcifications involving the aorta iliac arteries but no aneurysm or dissection. Other: The bladder, prostate gland and seminal vesicles are unremarkable. No pelvic mass, adenopathy or free pelvic fluid collections. No inguinal mass or adenopathy. No inguinal hernia. Musculoskeletal: No significant bony findings.  IMPRESSION: 1. Acute and severe sigmoid diverticulitis complicated by a focal walled-off perforation within air collection measuring 3 cm but no discrete abscess. Significant inflammation in the sigmoid mesocolon and inflamed adjacent small bowel loops. 2. No other significant acute abdominal/pelvic findings. 3. Age advanced atherosclerotic calcifications involving the aorta and iliac arteries. 4. Lower lobe pulmonary nodules are likely benign. No follow-up needed if patient is low-risk (and has no known or suspected primary neoplasm). Non-contrast chest CT can be considered in 12 months if patient is high-risk. This recommendation follows the consensus statement: Guidelines for Management of Incidental Pulmonary Nodules Detected on CT Images:From the Fleischner Society 2017; published online before print (10.1148/[id number]). Electronically Signed   By: Rudie Meyer M.D.   On: 02/03/2016 11:02    Anti-infectives: Anti-infectives    Start     Dose/Rate Route Frequency Ordered Stop   02/04/16 1115  piperacillin-tazobactam (ZOSYN) IVPB 3.375 g     3.375 g 12.5 mL/hr over 240 Minutes Intravenous Every 8 hours 02/04/16 1103     02/03/16 2000  metroNIDAZOLE (FLAGYL) IVPB 500 mg  Status:  Discontinued     500 mg 100 mL/hr over 60 Minutes Intravenous Every 8 hours 02/03/16 1732 02/04/16 1056   02/03/16 1800  ciprofloxacin (CIPRO) IVPB 400 mg  Status:  Discontinued     400 mg 200 mL/hr over 60 Minutes Intravenous Every 12 hours 02/03/16 1737 02/04/16 1056   02/03/16 1130  ceFAZolin (ANCEF) IVPB 2g/100 mL premix     2 g 200 mL/hr over 30 Minutes Intravenous  Once 02/03/16 1123 02/03/16 1223   02/03/16 1130  metroNIDAZOLE (FLAGYL) IVPB 500 mg     500 mg 100 mL/hr over 60 Minutes Intravenous  Once 02/03/16 1123 02/03/16 1303      Assessment/Plan: Acute Diverticulitis of Sigmoid - WBC 11.7 yesterday. Waiting for CBC today.  - currently on zosyn  - Advance to full liquids  EtOH  dependence Tobacco abuse Pulmonary Nodules Hypoalbuminemia  FEN: Full liquids  DVT Proph: Lovenox ID: Zosyn Day #2 Plan: IV Zosyn for next 24 hours. CBC in AM. If WBC continues to trend down, will transition to PO antibiotics.   Orvil Feil PASII  02/05/2016

## 2016-02-06 DIAGNOSIS — K5720 Diverticulitis of large intestine with perforation and abscess without bleeding: Secondary | ICD-10-CM | POA: Insufficient documentation

## 2016-02-06 LAB — CBC WITH DIFFERENTIAL/PLATELET
Basophils Absolute: 0 10*3/uL (ref 0.0–0.1)
Basophils Relative: 0 %
EOS PCT: 2 %
Eosinophils Absolute: 0.1 10*3/uL (ref 0.0–0.7)
HEMATOCRIT: 42.7 % (ref 39.0–52.0)
Hemoglobin: 14.4 g/dL (ref 13.0–17.0)
LYMPHS ABS: 1 10*3/uL (ref 0.7–4.0)
LYMPHS PCT: 14 %
MCH: 29.6 pg (ref 26.0–34.0)
MCHC: 33.7 g/dL (ref 30.0–36.0)
MCV: 87.7 fL (ref 78.0–100.0)
Monocytes Absolute: 0.8 10*3/uL (ref 0.1–1.0)
Monocytes Relative: 11 %
NEUTROS ABS: 5.2 10*3/uL (ref 1.7–7.7)
Neutrophils Relative %: 73 %
PLATELETS: 182 10*3/uL (ref 150–400)
RBC: 4.87 MIL/uL (ref 4.22–5.81)
RDW: 13 % (ref 11.5–15.5)
WBC: 7.2 10*3/uL (ref 4.0–10.5)

## 2016-02-06 LAB — BASIC METABOLIC PANEL
ANION GAP: 4 — AB (ref 5–15)
CHLORIDE: 102 mmol/L (ref 101–111)
CO2: 30 mmol/L (ref 22–32)
Calcium: 8.4 mg/dL — ABNORMAL LOW (ref 8.9–10.3)
Creatinine, Ser: 0.76 mg/dL (ref 0.61–1.24)
GFR calc Af Amer: >60 mL/min (ref >60)
GFR calc non Af Amer: >60 mL/min (ref >60)
GLUCOSE: 117 mg/dL — AB (ref 65–99)
POTASSIUM: 3.6 mmol/L (ref 3.5–5.1)
SODIUM: 136 mmol/L (ref 135–145)

## 2016-02-06 MED ORDER — AMOXICILLIN-POT CLAVULANATE 875-125 MG PO TABS
1.0000 | ORAL_TABLET | Freq: Two times a day (BID) | ORAL | Status: DC
  Administered 2016-02-06: 1 via ORAL
  Filled 2016-02-06: qty 1

## 2016-02-06 MED ORDER — HYDROCODONE-ACETAMINOPHEN 5-325 MG PO TABS: 1.0000 | ORAL_TABLET | ORAL | Status: DC | PRN

## 2016-02-06 MED ORDER — AMOXICILLIN-POT CLAVULANATE 875-125 MG PO TABS
1.0000 | ORAL_TABLET | Freq: Two times a day (BID) | ORAL | Status: DC
Start: 2016-02-06 — End: 2016-04-05

## 2016-02-06 NOTE — Discharge Summary (Signed)
Physician Discharge Summary  Darrell Hawkins UJW:[id number] DOB: [birth year] DOA: 02/03/2016  PCP: No PCP Per Patient  Admit date: 02/03/2016 Discharge date: 02/06/2016  Admitted From: home Disposition:  home  Recommendations for Outpatient Follow-up:  1. Follow up with PCP in 1-2 weeks 2. Please obtain BMP/CBC in one week   Home Health: no Equipment/Devices:none  Discharge Condition: stable CODE STATUS: FULL Diet recommendation: Heart Healthy    Brief/Interim Summary: 45 year old male with a history of alcohol dependence and tobacco abuse presented with two-day history of worsening abdominal pain. His abdominal pain began in the early morning of 02/01/2016. The patient continued to go to work and perform his daily activities. However he continued to have worsening abdominal pain. He stated that ambulation and weightbearing increases abdominal pain. He denied any fevers, chills, chest pain, shortness breath, vomiting, hematochezia, melena. Workup in the emergency department including CT of the abdomen and pelvis showed inflammation of the mid sigmoid colon with inflamed sigmoid nasal colitis and adjacent bowel loops. There is a wall perforation up to 3 cm. The patient was started on Cipro and Flagyl. General surgery was consulted and recommended non-operative management.  He improved clinically with medical therapy, IVF, bowel rest, abx.  His diet was advanced which he tolerated.  He was discharged with Augmentin.  Discharge Diagnoses:  Acute diverticulitis -cipro/Flagyl-->zosyn per surgery -Advance diet to soft which pt tolerated -continue IVF -pain control--D/C IV dilaudid--start po opioid -appreciate general surgery consult -WBC improved -home with amox-clav x 10 more days to complete 14 days of tx  Alcohol dependence -CIWA -no signs of withdrawl  Tobacco abuse -defers nicotine patch -cessation discussed  Pulmonary nodules -incidental finding on CT -outpt  surveillance   Discharge Instructions      Discharge Instructions    Call MD for:  difficulty breathing, headache or visual disturbances    Complete by:  As directed      Call MD for:  extreme fatigue    Complete by:  As directed      Call MD for:  hives    Complete by:  As directed      Call MD for:  persistant dizziness or light-headedness    Complete by:  As directed      Call MD for:  persistant nausea and vomiting    Complete by:  As directed      Call MD for:  redness, tenderness, or signs of infection (pain, swelling, redness, odor or green/yellow discharge around incision site)    Complete by:  As directed      Call MD for:  severe uncontrolled pain    Complete by:  As directed      Call MD for:  temperature >100.4    Complete by:  As directed      Diet - low sodium heart healthy    Complete by:  As directed      Discharge instructions    Complete by:  As directed   Low residue diet. Continue oral abx     Increase activity slowly    Complete by:  As directed      No wound care    Complete by:  As directed             Medication List    TAKE these medications        albuterol 108 (90 Base) MCG/ACT inhaler  Commonly known as:  PROVENTIL HFA;VENTOLIN HFA  Inhale 2 puffs into the lungs every 4 (four) hours  as needed for wheezing.     amoxicillin-clavulanate 875-125 MG tablet  Commonly known as:  AUGMENTIN  Take 1 tablet by mouth 2 (two) times daily.     ibuprofen 200 MG tablet  Commonly known as:  ADVIL,MOTRIN  Take 800 mg by mouth every 6 (six) hours as needed for mild pain.     nicotine 21 mg/24hr patch  Commonly known as:  NICODERM CQ - dosed in mg/24 hours  Place 1 patch (21 mg total) onto the skin daily.     traZODone 100 MG tablet  Commonly known as:  DESYREL  Take 1 tablet (100 mg total) by mouth at bedtime as needed for sleep (.).       Follow-up Information    Schedule an appointment as soon as possible for a visit with [facility].   Specialty:  Internal Medicine   Contact information:   [address] [place] [address] [phone]      Follow up with Emelia Loron, MD In 2 weeks.   Specialty:  General Surgery   Contact information:   [address] [place] [address] [phone]      No Known Allergies  Consultations:  General surgery   Procedures/Studies: Ct Abdomen Pelvis W Contrast  02/03/2016  CLINICAL DATA:  Right lower quadrant abdominal pain and difficulty urinating since Tuesday morning. Fever and chills. EXAM: CT ABDOMEN AND PELVIS WITH CONTRAST TECHNIQUE: Multidetector CT imaging of the abdomen and pelvis was performed using the standard protocol following bolus administration of intravenous contrast. CONTRAST:  ISOVUE-300 IOPAMIDOL (ISOVUE-300) INJECTION 61% COMPARISON:  None. FINDINGS: Lower chest: Bibasilar dependent atelectasis. Moderate breathing motion artifact. No infiltrates or effusions. No worrisome pulmonary lesions. Suspect small pulmonary nodule in the left lower lobe on image number 1 which measures 5 mm. There is also a right middle lobe nodule measuring 5 mm on image 10. These could be subpleural lymph nodes. They are likely benign. The heart is normal in size. No pericardial effusion. The distal esophagus is grossly normal. Hepatobiliary: No focal hepatic lesions or intrahepatic biliary dilatation. The gallbladder is normal. No common bile duct dilatation. Pancreas: No mass, inflammation or duct dilatation. Spleen: Normal size.  No focal lesions. Adrenals/Urinary Tract: The adrenal glands and kidneys are unremarkable. No inflammatory changes, mass lesions or obstructive findings. A small upper pole left renal calculus is noted incidentally. No obstructing ureteral calculi or bladder calculi. Stomach/Bowel: The stomach and duodenum are unremarkable. Significant inflammation of the mid sigmoid colon consistent with acute diverticulitis.  There is marked surrounding inflammation in the sigmoid mesocolon and also significant inflammation adjacent small bowel loops in the pelvis. This is complicated diverticulitis due to a focal walled-off perforation containing air. No discrete abscess. The remainder of the colon is unremarkable. The terminal ileum and appendix are normal. Moderate sigmoid diverticulosis. Vascular/Lymphatic: Small scattered mesenteric and retroperitoneal lymph nodes but no mass or adenopathy. Age advanced atherosclerotic calcifications involving the aorta iliac arteries but no aneurysm or dissection. Other: The bladder, prostate gland and seminal vesicles are unremarkable. No pelvic mass, adenopathy or free pelvic fluid collections. No inguinal mass or adenopathy. No inguinal hernia. Musculoskeletal: No significant bony findings. IMPRESSION: 1. Acute and severe sigmoid diverticulitis complicated by a focal walled-off perforation within air collection measuring 3 cm but no discrete abscess. Significant inflammation in the sigmoid mesocolon and inflamed adjacent small bowel loops. 2. No other significant acute abdominal/pelvic findings. 3. Age advanced atherosclerotic calcifications  involving the aorta and iliac arteries. 4. Lower lobe pulmonary nodules are likely benign. No follow-up needed if patient is low-risk (and has no known or suspected primary neoplasm). Non-contrast chest CT can be considered in 12 months if patient is high-risk. This recommendation follows the consensus statement: Guidelines for Management of Incidental Pulmonary Nodules Detected on CT Images:From the Fleischner Society 2017; published online before print (10.1148/radiol.[id number]). Electronically Signed   By: Rudie Meyer M.D.   On: 02/03/2016 11:02        Discharge Exam: Filed Vitals:   02/05/16 2025 02/06/16 0445  BP: 112/70 99/53  Pulse: 88 82  Temp: 98.5 F (36.9 C) 98.7 F (37.1 C)  Resp: 20 20   Filed Vitals:   02/05/16 1150  02/05/16 1315 02/05/16 2025 02/06/16 0445  BP: 121/82 114/62 112/70 99/53  Pulse: 93 80 88 82  Temp: 99.3 F (37.4 C) 98.8 F (37.1 C) 98.5 F (36.9 C) 98.7 F (37.1 C)  TempSrc: Oral Oral Oral Oral  Resp: 20 20 20 20   SpO2: 95% 96% 97% 96%    General: Pt is alert, awake, not in acute distress Cardiovascular: RRR, S1/S2 +, no rubs, no gallops Respiratory: CTA bilaterally, no wheezing, no rhonchi Abdominal: Soft, NT, ND, bowel sounds + Extremities: no edema, no cyanosis   The results of significant diagnostics from this hospitalization (including imaging, microbiology, ancillary and laboratory) are listed below for reference.    Significant Diagnostic Studies: Ct Abdomen Pelvis W Contrast  02/03/2016  CLINICAL DATA:  Right lower quadrant abdominal pain and difficulty urinating since Tuesday morning. Fever and chills. EXAM: CT ABDOMEN AND PELVIS WITH CONTRAST TECHNIQUE: Multidetector CT imaging of the abdomen and pelvis was performed using the standard protocol following bolus administration of intravenous contrast. CONTRAST:  ISOVUE-300 IOPAMIDOL (ISOVUE-300) INJECTION 61% COMPARISON:  None. FINDINGS: Lower chest: Bibasilar dependent atelectasis. Moderate breathing motion artifact. No infiltrates or effusions. No worrisome pulmonary lesions. Suspect small pulmonary nodule in the left lower lobe on image number 1 which measures 5 mm. There is also a right middle lobe nodule measuring 5 mm on image 10. These could be subpleural lymph nodes. They are likely benign. The heart is normal in size. No pericardial effusion. The distal esophagus is grossly normal. Hepatobiliary: No focal hepatic lesions or intrahepatic biliary dilatation. The gallbladder is normal. No common bile duct dilatation. Pancreas: No mass, inflammation or duct dilatation. Spleen: Normal size.  No focal lesions. Adrenals/Urinary Tract: The adrenal glands and kidneys are unremarkable. No inflammatory changes, mass lesions  or obstructive findings. A small upper pole left renal calculus is noted incidentally. No obstructing ureteral calculi or bladder calculi. Stomach/Bowel: The stomach and duodenum are unremarkable. Significant inflammation of the mid sigmoid colon consistent with acute diverticulitis. There is marked surrounding inflammation in the sigmoid mesocolon and also significant inflammation adjacent small bowel loops in the pelvis. This is complicated diverticulitis due to a focal walled-off perforation containing air. No discrete abscess. The remainder of the colon is unremarkable. The terminal ileum and appendix are normal. Moderate sigmoid diverticulosis. Vascular/Lymphatic: Small scattered mesenteric and retroperitoneal lymph nodes but no mass or adenopathy. Age advanced atherosclerotic calcifications involving the aorta iliac arteries but no aneurysm or dissection. Other: The bladder, prostate gland and seminal vesicles are unremarkable. No pelvic mass, adenopathy or free pelvic fluid collections. No inguinal mass or adenopathy. No inguinal hernia. Musculoskeletal: No significant bony findings. IMPRESSION: 1. Acute and severe sigmoid diverticulitis complicated by a focal walled-off perforation within air collection  measuring 3 cm but no discrete abscess. Significant inflammation in the sigmoid mesocolon and inflamed adjacent small bowel loops. 2. No other significant acute abdominal/pelvic findings. 3. Age advanced atherosclerotic calcifications involving the aorta and iliac arteries. 4. Lower lobe pulmonary nodules are likely benign. No follow-up needed if patient is low-risk (and has no known or suspected primary neoplasm). Non-contrast chest CT can be considered in 12 months if patient is high-risk. This recommendation follows the consensus statement: Guidelines for Management of Incidental Pulmonary Nodules Detected on CT Images:From the Fleischner Society 2017; published online before print  (10.1148/[id number]). Electronically Signed   By: Rudie Meyer M.D.   On: 02/03/2016 11:02     Microbiology: No results found for this or any previous visit (from the past 240 hour(s)).   Labs: Basic Metabolic Panel:  Recent Labs Lab 02/03/16 0857 02/04/16 0507 02/06/16 0340  NA 135 136 136  K 4.4 3.8 3.6  CL 105 106 102  CO2 24 25 30   GLUCOSE 90 84 117*  BUN 7 6 <5*  CREATININE 1.14 0.91 0.76  CALCIUM 8.7* 8.0* 8.4*   Liver Function Tests:  Recent Labs Lab 02/03/16 0857  AST 20  ALT 12*  ALKPHOS 68  BILITOT 1.5*  PROT 5.7*  ALBUMIN 3.0*    Recent Labs Lab 02/03/16 0857  LIPASE 18   No results for input(s): AMMONIA in the last 168 hours. CBC:  Recent Labs Lab 02/03/16 0857 02/04/16 0507 02/05/16 0831 02/06/16 0613  WBC 16.6* 11.7* 9.7 7.2  NEUTROABS 13.8*  --   --  5.2  HGB 16.5 13.2 14.6 14.4  HCT 49.4 40.7 43.9 42.7  MCV 90.6 91.5 90.1 87.7  PLT 191 176 179 182   Cardiac Enzymes: No results for input(s): CKTOTAL, CKMB, CKMBINDEX, TROPONINI in the last 168 hours. BNP: Invalid input(s): POCBNP CBG: No results for input(s): GLUCAP in the last 168 hours.  Time coordinating discharge:  Greater than 30 minutes  Signed:  Chan Rosasco, DO [facility] Pager: [phone] 02/06/2016, 2:15 PM

## 2016-02-06 NOTE — Progress Notes (Signed)
  Subjective: No complaints. Feels great  Objective: Vital signs in last 24 hours: Temp:  [98.5 F (36.9 C)-98.8 F (37.1 C)] 98.7 F (37.1 C) (07/22 0445) Pulse Rate:  [80-88] 82 (07/22 0445) Resp:  [20] 20 (07/22 0445) BP: (99-114)/(53-70) 99/53 mmHg (07/22 0445) SpO2:  [96 %-97 %] 96 % (07/22 0445) Last BM Date: 02/04/16  Intake/Output from previous day: 07/21 0701 - 07/22 0700 In: 2083.8 [P.O.:1258; I.V.:825.8] Out: 525 [Urine:525] Intake/Output this shift: Total I/O In: 540 [P.O.:540] Out: 300 [Urine:300]  Resp: clear to auscultation bilaterally Cardio: regular rate and rhythm GI: soft, flat, nontender  Lab Results:   Recent Labs  02/05/16 0831 02/06/16 0613  WBC 9.7 7.2  HGB 14.6 14.4  HCT 43.9 42.7  PLT 179 182   BMET  Recent Labs  02/04/16 0507 02/06/16 0340  NA 136 136  K 3.8 3.6  CL 106 102  CO2 25 30  GLUCOSE 84 117*  BUN 6 <5*  CREATININE 0.91 0.76  CALCIUM 8.0* 8.4*   PT/INR No results for input(s): LABPROT, INR in the last 72 hours. ABG No results for input(s): PHART, HCO3 in the last 72 hours.  Invalid input(s): PCO2, PO2  Studies/Results: No results found.  Anti-infectives: Anti-infectives    Start     Dose/Rate Route Frequency Ordered Stop   02/06/16 1215  amoxicillin-clavulanate (AUGMENTIN) 875-125 MG per tablet 1 tablet     1 tablet Oral Every 12 hours 02/06/16 1203     02/04/16 1115  piperacillin-tazobactam (ZOSYN) IVPB 3.375 g  Status:  Discontinued     3.375 g 12.5 mL/hr over 240 Minutes Intravenous Every 8 hours 02/04/16 1103 02/06/16 1204   02/03/16 2000  metroNIDAZOLE (FLAGYL) IVPB 500 mg  Status:  Discontinued     500 mg 100 mL/hr over 60 Minutes Intravenous Every 8 hours 02/03/16 1732 02/04/16 1056   02/03/16 1800  ciprofloxacin (CIPRO) IVPB 400 mg  Status:  Discontinued     400 mg 200 mL/hr over 60 Minutes Intravenous Every 12 hours 02/03/16 1737 02/04/16 1056   02/03/16 1130  ceFAZolin (ANCEF) IVPB 2g/100 mL  premix     2 g 200 mL/hr over 30 Minutes Intravenous  Once 02/03/16 1123 02/03/16 1223   02/03/16 1130  metroNIDAZOLE (FLAGYL) IVPB 500 mg     500 mg 100 mL/hr over 60 Minutes Intravenous  Once 02/03/16 1123 02/03/16 1303      Assessment/Plan: s/p * No surgery found * Advance diet. Start soft food Switch to oral augmentin If he tolerates then plan for discharge either later this pm or tomorrow  LOS: 3 days    TOTH III,PAUL S 02/06/2016

## 2016-02-07 NOTE — Care Management (Signed)
Encounter Post Discharge: Received call from [facility] to verify [facility] information as Pharmacist unable to verify information on letter pt has presented for payment with prescriptions to be filled. Clarified MRN number and DOB for Pharmacist after confirming that [facility] letter had been completed and offered to pt. No further CM needs at this time.

## 2016-04-04 ENCOUNTER — Encounter (HOSPITAL_COMMUNITY): Payer: Self-pay | Admitting: *Deleted

## 2016-04-04 DIAGNOSIS — K5732 Diverticulitis of large intestine without perforation or abscess without bleeding: Secondary | ICD-10-CM | POA: Insufficient documentation

## 2016-04-04 DIAGNOSIS — F17210 Nicotine dependence, cigarettes, uncomplicated: Secondary | ICD-10-CM | POA: Insufficient documentation

## 2016-04-04 NOTE — ED Triage Notes (Signed)
Reports lower abdominal pain, back pain for about 2 hours. No n/v/d or fevers. Hx of diverticulitis.

## 2016-04-05 ENCOUNTER — Telehealth (HOSPITAL_BASED_OUTPATIENT_CLINIC_OR_DEPARTMENT_OTHER): Payer: Self-pay | Admitting: Emergency Medicine

## 2016-04-05 ENCOUNTER — Emergency Department (HOSPITAL_COMMUNITY): Payer: Self-pay

## 2016-04-05 ENCOUNTER — Encounter (HOSPITAL_COMMUNITY): Payer: Self-pay | Admitting: Radiology

## 2016-04-05 ENCOUNTER — Emergency Department (HOSPITAL_COMMUNITY)
Admission: EM | Admit: 2016-04-05 | Discharge: 2016-04-05 | Disposition: A | Discharge status: Home / Self Care | Payer: Self-pay | Attending: Emergency Medicine | Admitting: Emergency Medicine

## 2016-04-05 DIAGNOSIS — R1032 Left lower quadrant pain: Secondary | ICD-10-CM

## 2016-04-05 DIAGNOSIS — K5732 Diverticulitis of large intestine without perforation or abscess without bleeding: Secondary | ICD-10-CM

## 2016-04-05 LAB — COMPREHENSIVE METABOLIC PANEL
ALBUMIN: 3.4 g/dL — AB (ref 3.5–5.0)
ALT: 12 U/L — ABNORMAL LOW (ref 17–63)
ANION GAP: 6 (ref 5–15)
AST: 14 U/L — ABNORMAL LOW (ref 15–41)
Alkaline Phosphatase: 56 U/L (ref 38–126)
BUN: 9 mg/dL (ref 6–20)
CO2: 27 mmol/L (ref 22–32)
Calcium: 8.9 mg/dL (ref 8.9–10.3)
Chloride: 107 mmol/L (ref 101–111)
Creatinine, Ser: 1.12 mg/dL (ref 0.61–1.24)
GFR calc non Af Amer: >60 mL/min (ref >60)
GLUCOSE: 108 mg/dL — AB (ref 65–99)
POTASSIUM: 3.7 mmol/L (ref 3.5–5.1)
SODIUM: 140 mmol/L (ref 135–145)
Total Bilirubin: 0.3 mg/dL (ref 0.3–1.2)
Total Protein: 5.5 g/dL — ABNORMAL LOW (ref 6.5–8.1)

## 2016-04-05 LAB — CBC
HEMATOCRIT: 44.6 % (ref 39.0–52.0)
HEMOGLOBIN: 15 g/dL (ref 13.0–17.0)
MCH: 30 pg (ref 26.0–34.0)
MCHC: 33.6 g/dL (ref 30.0–36.0)
MCV: 89.2 fL (ref 78.0–100.0)
Platelets: 215 10*3/uL (ref 150–400)
RBC: 5 MIL/uL (ref 4.22–5.81)
RDW: 14.7 % (ref 11.5–15.5)
WBC: 10.8 10*3/uL — ABNORMAL HIGH (ref 4.0–10.5)

## 2016-04-05 LAB — URINALYSIS, ROUTINE W REFLEX MICROSCOPIC
BILIRUBIN URINE: NEGATIVE
Glucose, UA: NEGATIVE mg/dL
HGB URINE DIPSTICK: NEGATIVE
Ketones, ur: NEGATIVE mg/dL
Leukocytes, UA: NEGATIVE
NITRITE: NEGATIVE
PH: 7 (ref 5.0–8.0)
Protein, ur: NEGATIVE mg/dL
SPECIFIC GRAVITY, URINE: 1.019 (ref 1.005–1.030)

## 2016-04-05 LAB — URINE MICROSCOPIC-ADD ON
RBC / HPF: NONE SEEN RBC/hpf (ref 0–5)
WBC UA: NONE SEEN WBC/hpf (ref 0–5)

## 2016-04-05 LAB — LIPASE, BLOOD: LIPASE: 46 U/L (ref 11–51)

## 2016-04-05 MED ORDER — HYDROMORPHONE HCL 1 MG/ML IJ SOLN
1.0000 mg | Freq: Once | INTRAMUSCULAR | Status: AC
  Administered 2016-04-05: 1 mg via INTRAVENOUS
  Filled 2016-04-05: qty 1

## 2016-04-05 MED ORDER — METRONIDAZOLE IN NACL 5-0.79 MG/ML-% IV SOLN
500.0000 mg | Freq: Once | INTRAVENOUS | Status: AC
  Administered 2016-04-05: 500 mg via INTRAVENOUS
  Filled 2016-04-05: qty 100

## 2016-04-05 MED ORDER — METRONIDAZOLE 500 MG PO TABS: 500.0000 mg | ORAL_TABLET | Freq: Four times a day (QID) | ORAL | 0 refills | Status: DC

## 2016-04-05 MED ORDER — HYDROCODONE-ACETAMINOPHEN 5-325 MG PO TABS: 1.0000 | ORAL_TABLET | Freq: Four times a day (QID) | ORAL | 0 refills | Status: DC | PRN

## 2016-04-05 MED ORDER — CIPROFLOXACIN IN D5W 400 MG/200ML IV SOLN
400.0000 mg | Freq: Once | INTRAVENOUS | Status: AC
  Administered 2016-04-05: 400 mg via INTRAVENOUS
  Filled 2016-04-05: qty 200

## 2016-04-05 MED ORDER — MORPHINE SULFATE (PF) 4 MG/ML IV SOLN
4.0000 mg | Freq: Once | INTRAVENOUS | Status: AC
Start: 2016-04-05 — End: 2016-04-05
  Administered 2016-04-05: 4 mg via INTRAVENOUS
  Filled 2016-04-05: qty 1

## 2016-04-05 MED ORDER — ONDANSETRON HCL 4 MG/2ML IJ SOLN
4.0000 mg | Freq: Once | INTRAMUSCULAR | Status: AC
  Administered 2016-04-05: 4 mg via INTRAVENOUS
  Filled 2016-04-05: qty 2

## 2016-04-05 MED ORDER — SODIUM CHLORIDE 0.9 % IV BOLUS (SEPSIS)
1000.0000 mL | Freq: Once | INTRAVENOUS | Status: AC
  Administered 2016-04-05: 1000 mL via INTRAVENOUS

## 2016-04-05 MED ORDER — IOPAMIDOL (ISOVUE-300) INJECTION 61%
INTRAVENOUS | Status: AC
  Administered 2016-04-05: 100 mL
  Filled 2016-04-05: qty 100

## 2016-04-05 MED ORDER — SODIUM CHLORIDE 0.9 % IV BOLUS (SEPSIS)
1000.0000 mL | Freq: Once | INTRAVENOUS | Status: AC
Start: 2016-04-05 — End: 2016-04-05
  Administered 2016-04-05: 1000 mL via INTRAVENOUS

## 2016-04-05 MED ORDER — CIPROFLOXACIN HCL 500 MG PO TABS: 500.0000 mg | ORAL_TABLET | Freq: Two times a day (BID) | ORAL | 0 refills | Status: DC

## 2016-04-05 MED FILL — metroNIDAZOLE 500 MG TABS: 500 | 7 days supply | Qty: 28 | Fill #0

## 2016-04-05 MED FILL — ?CIPROFLOXACIN HCL 500MG TA: 500 | 7 days supply | Qty: 14 | Fill #0

## 2016-04-05 NOTE — ED Provider Notes (Addendum)
[facility]-EMERGENCY DEPT Provider Note   CSN: [id number] Arrival date & time: 04/04/16  2332     History   Chief Complaint Chief Complaint  Patient presents with  . Abdominal Pain    HPI Darrell Hawkins is a 45 y.o. male.  Patient c/o lower abd pain, esp left, onset yesterday. Pain is constant, dull, moderate, worse this AM, worse w palpation. Similar to prior diverticulitis pain, but not as severe. Denies back or flank pain. No scrotal or testicular pain. No dysuria or hematuria. Normal appetite. No vomiting or diarrhea. bm yesterday. No fever or chills.     Abdominal Pain   Pertinent negatives include fever and headaches.    Past Medical History:  Diagnosis Date  . Depression   . Diverticulitis hospitalized 02/03/2016  . Migraine    "once/year maybe" (02/03/2016)    Patient Active Problem List   Diagnosis Date Noted  . Diverticulitis of large intestine with perforation without bleeding   . Diverticulitis 02/03/2016  . EtOH dependence (HCC) 02/03/2016  . Tobacco abuse 02/03/2016  . Pulmonary nodules 02/03/2016  . Insomnia   . Alcohol dependence with uncomplicated withdrawal (HCC) 05/19/2015  . 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 Recurrent major depression (HCC) 05/19/2015    Past Surgical History:  Procedure Laterality Date  . NO PAST SURGERIES         Home Medications    Prior to Admission medications   Medication Sig Start Date End Date Taking? Authorizing Provider  albuterol (PROVENTIL HFA;VENTOLIN HFA) 108 (90 BASE) MCG/ACT inhaler Inhale 2 puffs into the lungs every 4 (four) hours as needed for wheezing. Patient not taking: Reported on 05/19/2015 02/13/14   Graylon Good, PA-C  amoxicillin-clavulanate (AUGMENTIN) 875-125 MG tablet Take 1 tablet by mouth 2 (two) times daily. 02/06/16   Chevis Pretty III, MD  ibuprofen (ADVIL,MOTRIN) 200 MG tablet Take 800 mg by mouth every 6 (six) hours as needed for mild pain.    Historical Provider, MD    nicotine (NICODERM CQ - DOSED IN MG/24 HOURS) 21 mg/24hr patch Place 1 patch (21 mg total) onto the skin daily. Patient not taking: Reported on 02/03/2016 05/20/15   Beau Fanny, FNP  traZODone (DESYREL) 100 MG tablet Take 1 tablet (100 mg total) by mouth at bedtime as needed for sleep (.). 05/20/15   Beau Fanny, FNP    Family History Family History  Problem Relation Age of Onset  . Lung cancer Mother     Social History Social History  Substance Use Topics  . Smoking status: Current Every Day Smoker    Packs/day: 1.00    Years: 20.00    Types: Cigarettes  . Smokeless tobacco: Former Neurosurgeon    Types: Chew  . Alcohol use 14.4 oz/week    24 Cans of beer per week     Comment: 02/03/2016 "1, 40oz beer/day"     Allergies   Review of patient's allergies indicates no known allergies.   Review of Systems Review of Systems  Constitutional: Negative for fever.  HENT: Negative for sore throat.   Eyes: Negative for redness.  Respiratory: Negative for shortness of breath.   Cardiovascular: Negative for chest pain.  Gastrointestinal: Positive for abdominal pain.  Genitourinary: Negative for flank pain.  Musculoskeletal: Negative for back pain and neck pain.  Skin: Negative for rash.  Neurological: Negative for headaches.  Hematological: Does not bruise/bleed easily.  Psychiatric/Behavioral: Negative for confusion.     Physical Exam Updated Vital Signs BP  130/80 (BP Location: Left Arm)   Pulse 80   Temp 98.3 F (36.8 C) (Oral)   Resp 18   Ht 5\' 8"  (1.727 m)   Wt 71.2 kg   SpO2 99%   BMI 23.87 kg/m   Physical Exam  Constitutional: He appears well-developed and well-nourished. No distress.  HENT:  Head: Atraumatic.  Mouth/Throat: Oropharynx is clear and moist.  Eyes: Conjunctivae are normal.  Neck: Neck supple. No tracheal deviation present.  Cardiovascular: Normal rate, regular rhythm, normal heart sounds and intact distal pulses.   No murmur  heard. Pulmonary/Chest: Effort normal. No accessory muscle usage. No respiratory distress.  Abdominal: Soft. Bowel sounds are normal. He exhibits no distension and no mass. There is tenderness. There is no rebound and no guarding. No hernia.  Tenderness suprapubic and LLQ.   Genitourinary:  Genitourinary Comments: No cva tenderness  Musculoskeletal: He exhibits edema. He exhibits no tenderness.  Neurological: He is alert.  Skin: Skin is warm and dry. He is not diaphoretic.  Psychiatric: He has a normal mood and affect.  Nursing note and vitals reviewed.    ED Treatments / Results  Labs (all labs ordered are listed, but only abnormal results are displayed) Results for orders placed or performed during the hospital encounter of 04/05/16  Lipase, blood  Result Value Ref Range   Lipase 46 11 - 51 U/L  Comprehensive metabolic panel  Result Value Ref Range   Sodium 140 135 - 145 mmol/L   Potassium 3.7 3.5 - 5.1 mmol/L   Chloride 107 101 - 111 mmol/L   CO2 27 22 - 32 mmol/L   Glucose, Bld 108 (H) 65 - 99 mg/dL   BUN 9 6 - 20 mg/dL   Creatinine, Ser 1.301.12 0.61 - 1.24 mg/dL   Calcium 8.9 8.9 - 86.510.3 mg/dL   Total Protein 5.5 (L) 6.5 - 8.1 g/dL   Albumin 3.4 (L) 3.5 - 5.0 g/dL   AST 14 (L) 15 - 41 U/L   ALT 12 (L) 17 - 63 U/L   Alkaline Phosphatase 56 38 - 126 U/L   Total Bilirubin 0.3 0.3 - 1.2 mg/dL   GFR calc non Af Amer >60 >60 mL/min   GFR calc Af Amer >60 >60 mL/min   Anion gap 6 5 - 15  CBC  Result Value Ref Range   WBC 10.8 (H) 4.0 - 10.5 K/uL   RBC 5.00 4.22 - 5.81 MIL/uL   Hemoglobin 15.0 13.0 - 17.0 g/dL   HCT 78.444.6 69.639.0 - 29.552.0 %   MCV 89.2 78.0 - 100.0 fL   MCH 30.0 26.0 - 34.0 pg   MCHC 33.6 30.0 - 36.0 g/dL   RDW 28.414.7 13.211.5 - 44.015.5 %   Platelets 215 150 - 400 K/uL  Urinalysis, Routine w reflex microscopic  Result Value Ref Range   Color, Urine YELLOW YELLOW   APPearance TURBID (A) CLEAR   Specific Gravity, Urine 1.019 1.005 - 1.030   pH 7.0 5.0 - 8.0   Glucose,  UA NEGATIVE NEGATIVE mg/dL   Hgb urine dipstick NEGATIVE NEGATIVE   Bilirubin Urine NEGATIVE NEGATIVE   Ketones, ur NEGATIVE NEGATIVE mg/dL   Protein, ur NEGATIVE NEGATIVE mg/dL   Nitrite NEGATIVE NEGATIVE   Leukocytes, UA NEGATIVE NEGATIVE  Urine microscopic-add on  Result Value Ref Range   Squamous Epithelial / LPF 0-5 (A) NONE SEEN   WBC, UA NONE SEEN 0 - 5 WBC/hpf   RBC / HPF NONE SEEN 0 - 5 RBC/hpf  Bacteria, UA FEW (A) NONE SEEN   Urine-Other AMORPHOUS URATES/PHOSPHATES    Ct Abdomen Pelvis W Contrast  Result Date: 04/05/2016 CLINICAL DATA:  Bilateral lower abdominal pain for 2 days EXAM: CT ABDOMEN AND PELVIS WITH CONTRAST TECHNIQUE: Multidetector CT imaging of the abdomen and pelvis was performed using the standard protocol following bolus administration of intravenous contrast. CONTRAST:  ISOVUE-300 IOPAMIDOL (ISOVUE-300) INJECTION 61% COMPARISON:  02/02/2016 FINDINGS: Lower chest: No acute finding. Stable subpleural pulmonary nodule in the right middle lobe as described on previous exam. Hepatobiliary: No focal liver abnormality.No evidence of biliary obstruction or stone. Pancreas: Unremarkable. Spleen: Unremarkable. Adrenals/Urinary Tract: Negative adrenals. No hydronephrosis or stone. Unremarkable bladder. Stomach/Bowel: Complicated diverticulitis of the sigmoid colon on previous study with contained perforation. Inflammation in the sigmoid and sigmoid mesentery is improved, but there is a persistent extra luminal contained mesenteric gas collection measuring up to 21 mm. Along this gas collection is a loop of small bowel that has eccentric wall thickening and mesenteric stranding. This is likely reactive enteritis; no signs of foreign body or diverticulum in this region to suggest primary small bowel process. No appendicitis. Vascular/Lymphatic: Aortoiliac atherosclerosis. No acute vascular finding. No mass or adenopathy. Reproductive:No pathologic findings. Other: No ascites or  pneumoperitoneum. Musculoskeletal: No acute abnormalities. IMPRESSION: 1. Recent complicated sigmoid diverticulitis. Sigmoid inflammation is improved but there is a persistent 2 cm mesenteric gas collection with reactive enteritis. 2. Tiny left renal calculus. Electronically Signed   By: Marnee Spring M.D.   On: 04/05/2016 08:55    EKG  EKG Interpretation None       Radiology No results found.  Procedures Procedures (including critical care time)  Medications Ordered in ED Medications  morphine 4 MG/ML injection 4 mg (not administered)  ondansetron (ZOFRAN) injection 4 mg (not administered)  sodium chloride 0.9 % bolus 1,000 mL (not administered)     Initial Impression / Assessment and Plan / ED Course  I have reviewed the triage vital signs and the nursing notes.  Pertinent labs & imaging results that were available during my care of the patient were reviewed by me and considered in my medical decision making (see chart for details).  Clinical Course   Iv ns bolus. Morphine iv. zofran iv.   Labs.  CT.  Reviewed nursing notes and prior charts for additional history.   ?residual vs new diverticulitis. Patient does have LLQ pain and tenderness.  Will rx abx.   Recheck pt comfortable. Tolerating po.  Patient currently appears stable for d/c.   1320 recheck bp 107/71, patient indicates feels improved, ready for d/c.     Final Clinical Impressions(s) / ED Diagnoses   Final diagnoses:  None    New Prescriptions New Prescriptions   No medications on file     Cathren Laine, MD 04/05/16 1610    Cathren Laine, MD 04/05/16 1321

## 2016-04-05 NOTE — ED Notes (Signed)
Gave pt a water and a [place] sandwich.

## 2016-04-05 NOTE — Discharge Instructions (Signed)
It was our pleasure to provide your ER care today - we hope that you feel better.  Rest. Drink plenty of fluids.  Take cipro and flagyl (antibiotics) as prescribed.  Take motrin or aleve as need for pain. You may also take hydrocodone as need for pain. No driving for the next 6 hours or when taking hydrocodone. Also, do not take tylenol or acetaminophen containing medication when taking hydrocodone.  Follow up with primary care doctor in 1 week if symptoms fail to improve/resolve.  Return to ER if worse, new symptoms, fevers, worsening or severe pain, persistent vomiting, other concern.

## 2016-04-05 NOTE — ED Notes (Signed)
Gave pt iced water, informed Jessica-RN.

## 2017-04-04 ENCOUNTER — Encounter (HOSPITAL_COMMUNITY): Payer: Self-pay | Admitting: Emergency Medicine

## 2017-04-04 ENCOUNTER — Emergency Department (HOSPITAL_COMMUNITY)
Admission: EM | Admit: 2017-04-04 | Discharge: 2017-04-04 | Disposition: A | Discharge status: Home / Self Care | Payer: Self-pay | Attending: Emergency Medicine | Admitting: Emergency Medicine

## 2017-04-04 ENCOUNTER — Emergency Department (HOSPITAL_COMMUNITY): Payer: Self-pay

## 2017-04-04 DIAGNOSIS — J40 Bronchitis, not specified as acute or chronic: Secondary | ICD-10-CM | POA: Insufficient documentation

## 2017-04-04 DIAGNOSIS — F17210 Nicotine dependence, cigarettes, uncomplicated: Secondary | ICD-10-CM | POA: Insufficient documentation

## 2017-04-04 MED ORDER — AEROCHAMBER PLUS W/MASK MISC
1.0000 | Freq: Once | Status: AC
  Administered 2017-04-04: 1
  Filled 2017-04-04: qty 1

## 2017-04-04 MED ORDER — ALBUTEROL SULFATE HFA 108 (90 BASE) MCG/ACT IN AERS
2.0000 | INHALATION_SPRAY | RESPIRATORY_TRACT | Status: DC | PRN
  Administered 2017-04-04: 2 via RESPIRATORY_TRACT
  Filled 2017-04-04: qty 6.7

## 2017-04-04 MED ORDER — ACETAMINOPHEN 325 MG PO TABS
650.0000 mg | ORAL_TABLET | Freq: Once | ORAL | Status: AC
  Administered 2017-04-04: 650 mg via ORAL
  Filled 2017-04-04: qty 2

## 2017-04-04 NOTE — ED Notes (Signed)
Pt alert & oriented x4, stable gait. Patient given discharge instructions, paperwork & prescription(s). Patient  instructed to stop at the registration desk to finish any additional paperwork. Patient verbalized understanding. Pt left department w/ no further questions. 

## 2017-04-04 NOTE — Discharge Instructions (Signed)
Use your inhaler 2 puffs every 4 hours as needed for cough or shortness of breath. Take Tylenol as directed for aches.  Call the [facility] tomorrow to get a primary care physician or you can call the number on these instructions. Bronchitis may last for several weeks.Ask your new primary care physician to help you to stop smoking

## 2017-04-04 NOTE — ED Triage Notes (Signed)
Pt c/o cough, nasal congestion and chills since Friday.

## 2017-04-04 NOTE — ED Notes (Signed)
ED Provider at bedside. 

## 2017-04-04 NOTE — ED Provider Notes (Signed)
[facility]-EMERGENCY DEPT Provider Note   CSN: [id number] Arrival date & time: 04/04/17  1909     History   Chief Complaint Chief Complaint  Patient presents with  . Cough    HPI Darrell Hawkins is a 46 y.o. male.complains ofcough productive of yellowish sputum and nasal congestion and mild headache onset 4 days ago. Other associated symptoms include chills and subjective fever. No treatment prior to coming here. Nothing makes symptoms better or worse. No other associated symptoms. Several others in his household with similar symptoms  HPI  Past Medical History:  Diagnosis Date  . Depression   . Diverticulitis hospitalized 02/03/2016  . Migraine    "once/year maybe" (02/03/2016)    Patient Active Problem List   Diagnosis Date Noted  . Diverticulitis of large intestine with perforation without bleeding   . Diverticulitis 02/03/2016  . EtOH dependence (HCC) 02/03/2016  . Tobacco abuse 02/03/2016  . Pulmonary nodules 02/03/2016  . Insomnia   . Alcohol dependence with uncomplicated withdrawal (HCC) 05/19/2015  . 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 Recurrent major depression (HCC) 05/19/2015    Past Surgical History:  Procedure Laterality Date  . NO PAST SURGERIES         Home Medications    Prior to Admission medications   Not on File    Family History Family History  Problem Relation Age of Onset  . Lung cancer Mother     Social History Social History  Substance Use Topics  . Smoking status: Current Every Day Smoker    Packs/day: 1.00    Years: 20.00    Types: Cigarettes  . Smokeless tobacco: Former Neurosurgeon    Types: Chew  . Alcohol use No     Comment: 02/03/2016 "1, 40oz beer/day"   Denies alcohol 1 year. No illicit drug use  Allergies   Patient has no known allergies.   Review of Systems Review of Systems  Constitutional: Positive for chills and fever.  HENT: Positive for congestion.   Respiratory: Positive for cough.     Cardiovascular: Negative.   Gastrointestinal: Negative.   Musculoskeletal: Negative.   Skin: Negative.   Neurological: Positive for headaches.  Psychiatric/Behavioral: Negative.   All other systems reviewed and are negative.    Physical Exam Updated Vital Signs BP 121/87   Pulse 85   Temp 98.6 F (37 C)   Resp 20   Ht  (1.753 m)   Wt 86.9 kg (191 lb 8 oz)   SpO2 96%   BMI 28.28 kg/m   Physical Exam  Constitutional: He is oriented to person, place, and time. He appears well-developed and well-nourished. No distress.  HENT:  Head: Normocephalic and atraumatic.  Nose: Nose normal.  Mouth/Throat: Oropharynx is clear and moist. No oropharyngeal exudate.  Eyes: Pupils are equal, round, and reactive to light. Conjunctivae are normal.  Neck: Neck supple. No tracheal deviation present. No thyromegaly present.  Cardiovascular: Normal rate and regular rhythm.   No murmur heard. Pulmonary/Chest: Effort normal and breath sounds normal.  Scant diffuse rhonchi  Abdominal: Soft. Bowel sounds are normal. He exhibits no distension. There is no tenderness.  Musculoskeletal: Normal range of motion. He exhibits no edema or tenderness.  Neurological: He is alert and oriented to person, place, and time. Coordination normal.  Skin: Skin is warm and dry. No rash noted.  Psychiatric: He has a normal mood and affect.  Nursing note and vitals reviewed.    ED Treatments / Results  Labs (all  labs ordered are listed, but only abnormal results are displayed) Labs Reviewed - No data to display  EKG  EKG Interpretation None       Radiology Dg Chest 2 View  Result Date: 04/04/2017 CLINICAL DATA:  Productive cough, nasal congestion, sob, and chills since Friday. No prior history of heart or lung conditions, smoker. EXAM: CHEST  2 VIEW COMPARISON:  02/13/2014 FINDINGS: Mild hyperinflation. Midline trachea. Borderline cardiomegaly. Mild left hemidiaphragm elevation. No pleural effusion or  pneumothorax. Biapical pleural thickening. Moderate interstitial thickening. No lobar consolidation. IMPRESSION: 1.  No acute cardiopulmonary disease. 2. Mild peribronchial thickening which may relate to chronic bronchitis or smoking. Electronically Signed   By: Jeronimo Greaves M.D.   On: 04/04/2017 20:37   chest x-ray viewed by me Procedures Procedures (including critical care time)  Medications Ordered in ED Medications  albuterol (PROVENTIL HFA;VENTOLIN HFA) 108 (90 Base) MCG/ACT inhaler 2 puff (not administered)  aerochamber plus with mask device 1 each (not administered)  acetaminophen (TYLENOL) tablet 650 mg (not administered)     Initial Impression / Assessment and Plan / ED Course  I have reviewed the triage vital signs and the nursing notes.  Pertinent labs & imaging results that were available during my care of the patient were reviewed by me and considered in my medical decision making (see chart for details).     Counseled patient for 5 minutes on smoking cessation Than albuterol HFA with spacer to go. Tylenol for aches. Final Clinical Impressions(s) / ED Diagnoses  Diagnosis #1 acute bronchitis #2 tobacco abuse Final diagnoses:  Bronchitis    New Prescriptions New Prescriptions   No medications on file     Doug Sou, MD 04/04/17 2208

## 2018-02-22 IMAGING — CT CT ABD-PELV W/ CM
2 of 5 series · 16 of 46 positions shown, 18 images · IV contrast (APPLIED)
Comparison: 02/02/2016

CLINICAL DATA: Bilateral lower abdominal pain for 2 days

EXAM:
CT ABDOMEN AND PELVIS WITH CONTRAST
TECHNIQUE: Multidetector CT imaging of the abdomen and pelvis was performed
using the standard protocol following bolus administration of
intravenous contrast.
CONTRAST:  100mL CO8W0C-GEE IOPAMIDOL (CO8W0C-GEE) INJECTION 61%

[Series 2: abd/ pelvis 5.0 i30f 1 · axial · 0.72mm/px · z∈[+942,+1337]mm · 13 of 89 slices shown, 15 images]
[im 5/89  soft-tissue]
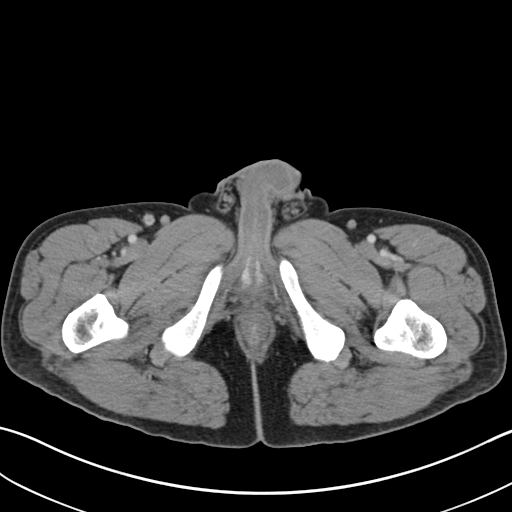
[im 5/89  bone]
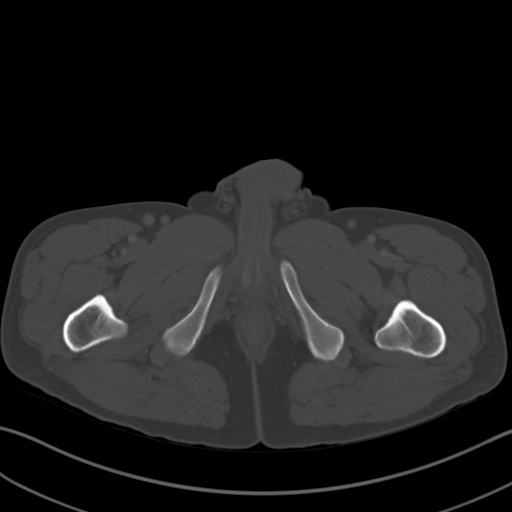
[im 10/89  soft-tissue]
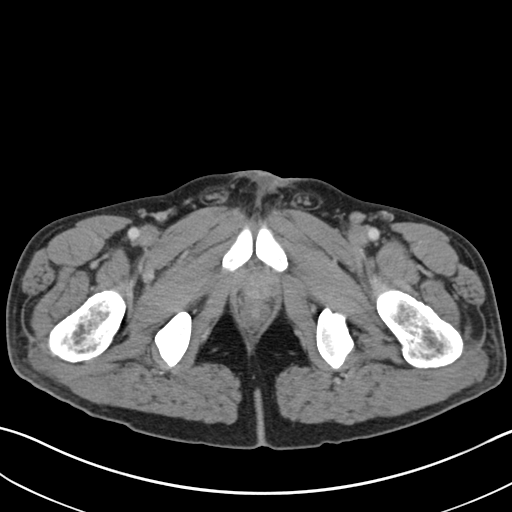
[im 20/89  soft-tissue]
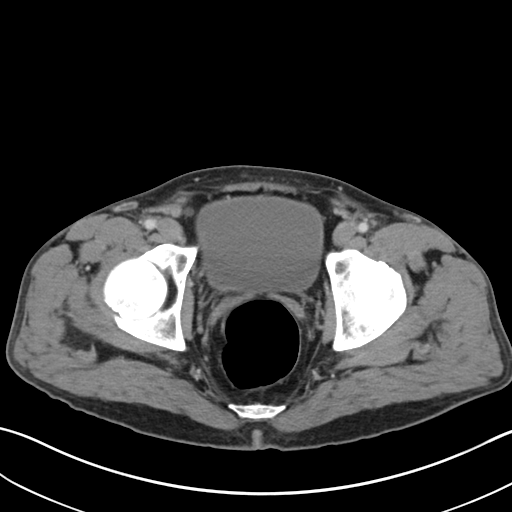
[im 25/89  soft-tissue]
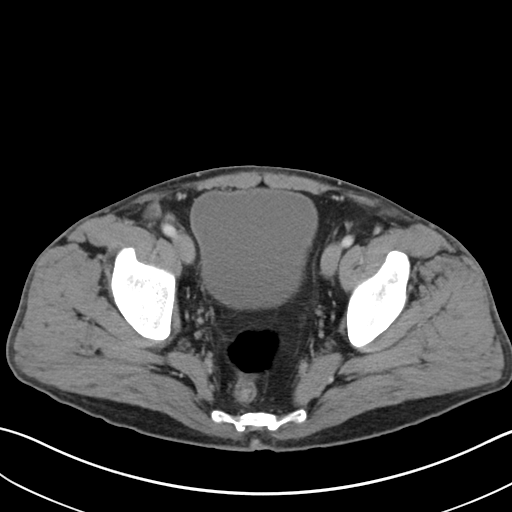
[im 30/89  soft-tissue]
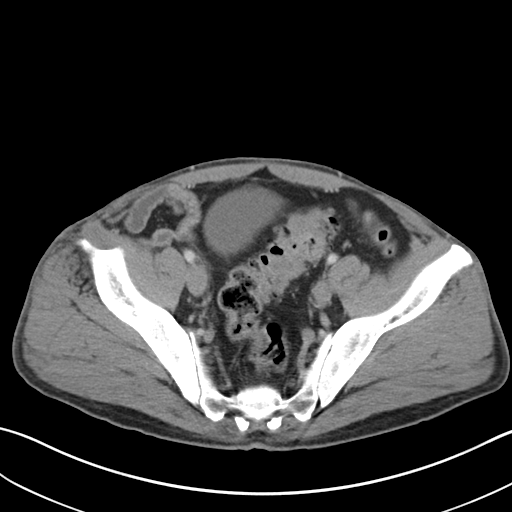
[im 40/89  soft-tissue]
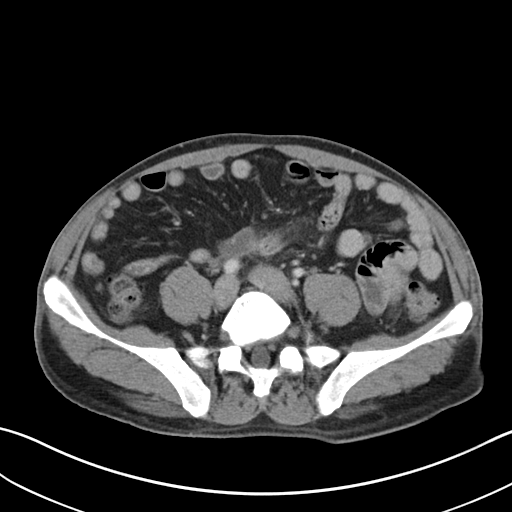
[im 45/89  soft-tissue]
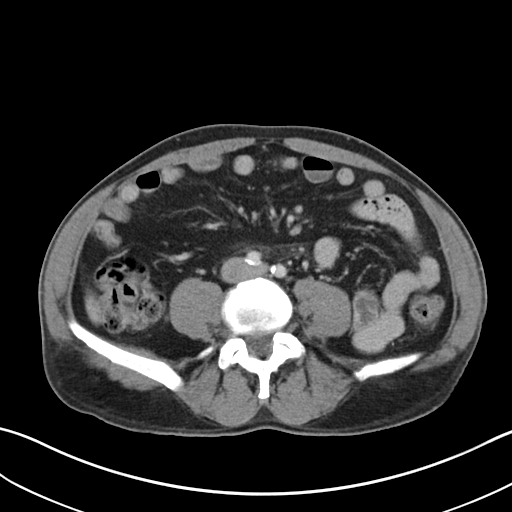
[im 49/89  soft-tissue]
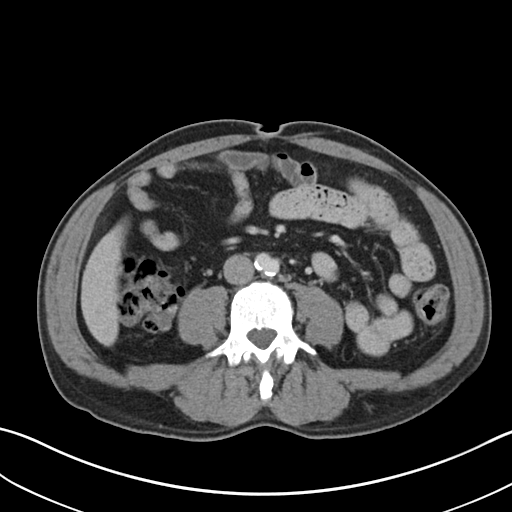
[im 59/89  soft-tissue]
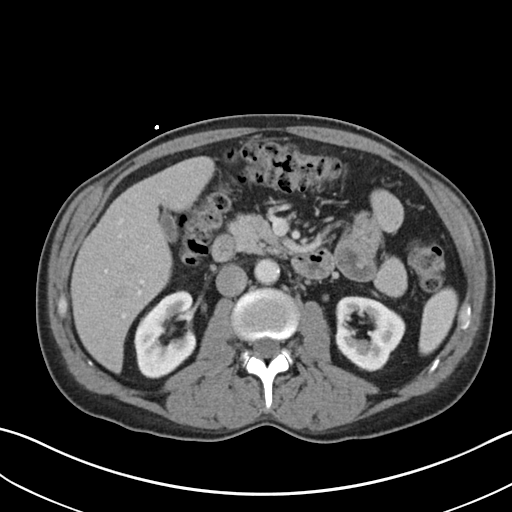
[im 59/89  bone]
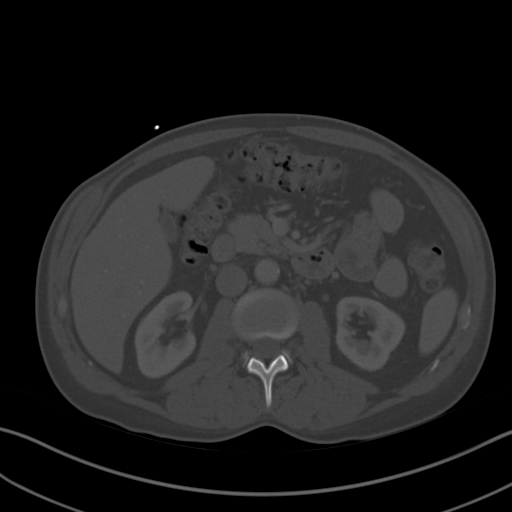
[im 64/89  soft-tissue]
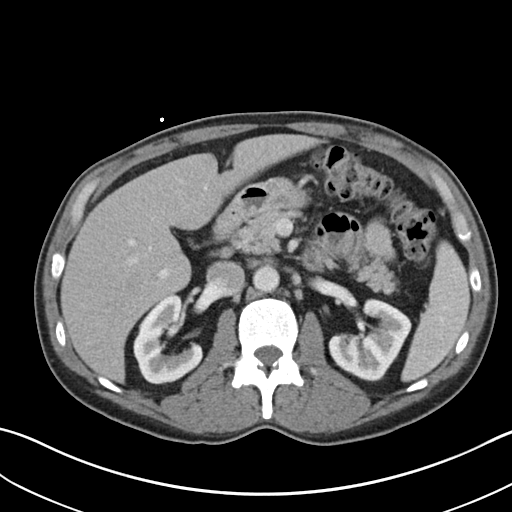
[im 69/89  soft-tissue]
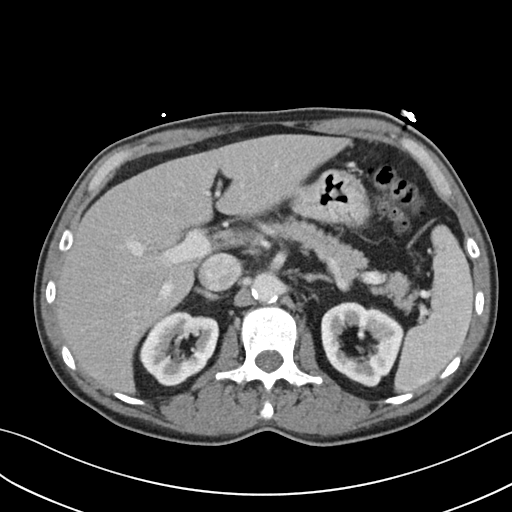
[im 79/89  soft-tissue]
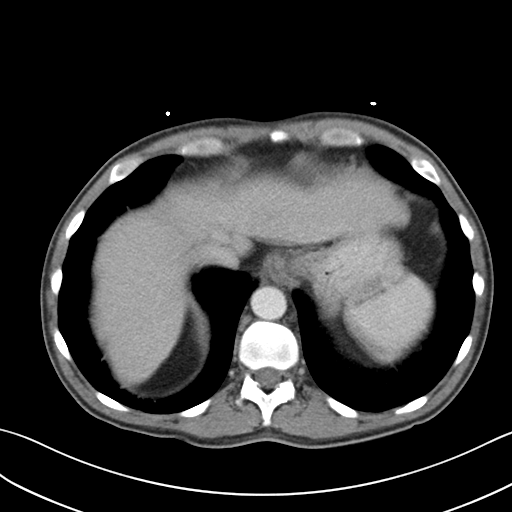
[im 84/89  soft-tissue]
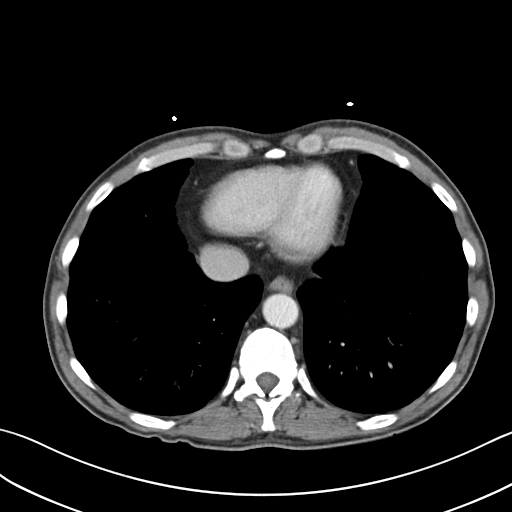

[Series 5: coronal soft tissue · coronal · 0.73mm/px · 3 of 89 slices shown]
[im 30/89  soft-tissue]
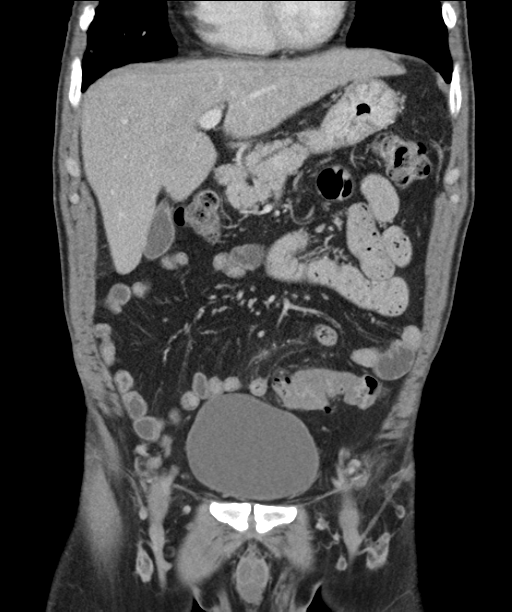
[im 40/89  soft-tissue]
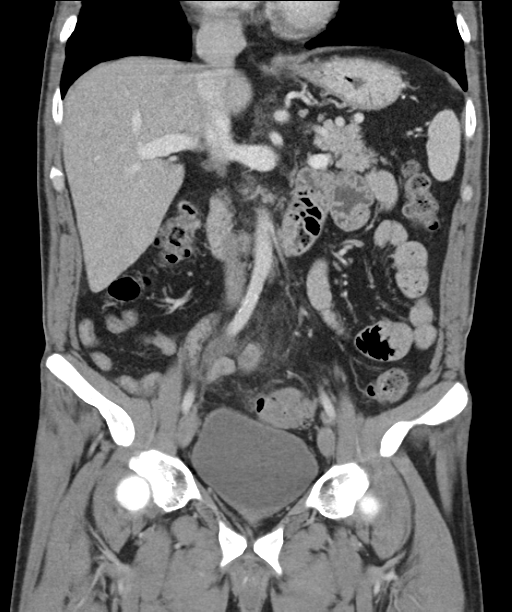
[im 49/89  soft-tissue]
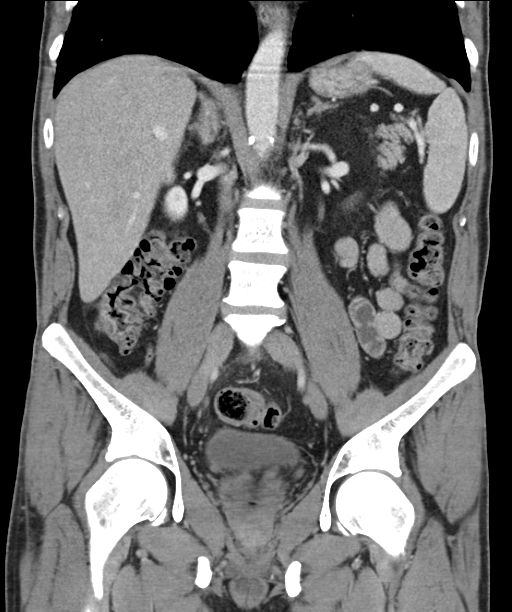

[16 of 46 positions shown; findings below may reference images not displayed]

FINDINGS: Lower chest: No acute finding. Stable subpleural pulmonary nodule in
the right middle lobe as described on previous exam.

Hepatobiliary: No focal liver abnormality.No evidence of biliary
obstruction or stone.

Pancreas: Unremarkable.

Spleen: Unremarkable.

Adrenals/Urinary Tract: Negative adrenals. No hydronephrosis or
stone. Unremarkable bladder.

Stomach/Bowel: Complicated diverticulitis of the sigmoid colon on
previous study with contained perforation. Inflammation in the
sigmoid and sigmoid mesentery is improved, but there is a persistent
extra luminal contained mesenteric gas collection measuring up to 21
mm. Along this gas collection is a loop of small bowel that has
eccentric wall thickening and mesenteric stranding. This is likely
reactive enteritis; no signs of foreign body or diverticulum in this
region to suggest primary small bowel process. No appendicitis.

Vascular/Lymphatic: Aortoiliac atherosclerosis. No acute vascular
finding. No mass or adenopathy.

Reproductive:No pathologic findings.

Other: No ascites or pneumoperitoneum.

Musculoskeletal: No acute abnormalities.
IMPRESSION: 1. Recent complicated sigmoid diverticulitis. Sigmoid inflammation
is improved but there is a persistent 2 cm mesenteric gas collection
with reactive enteritis.
2. Tiny left renal calculus.

## 2019-02-21 IMAGING — DX DG CHEST 2V
2 series · 2 of 2 positions shown · non-contrast
Comparison: 02/13/2014

CLINICAL DATA: Productive cough, nasal congestion, sob, and chills
since [REDACTED]. No prior history of heart or lung conditions, smoker.

EXAM:
CHEST  2 VIEW

[chest pa]
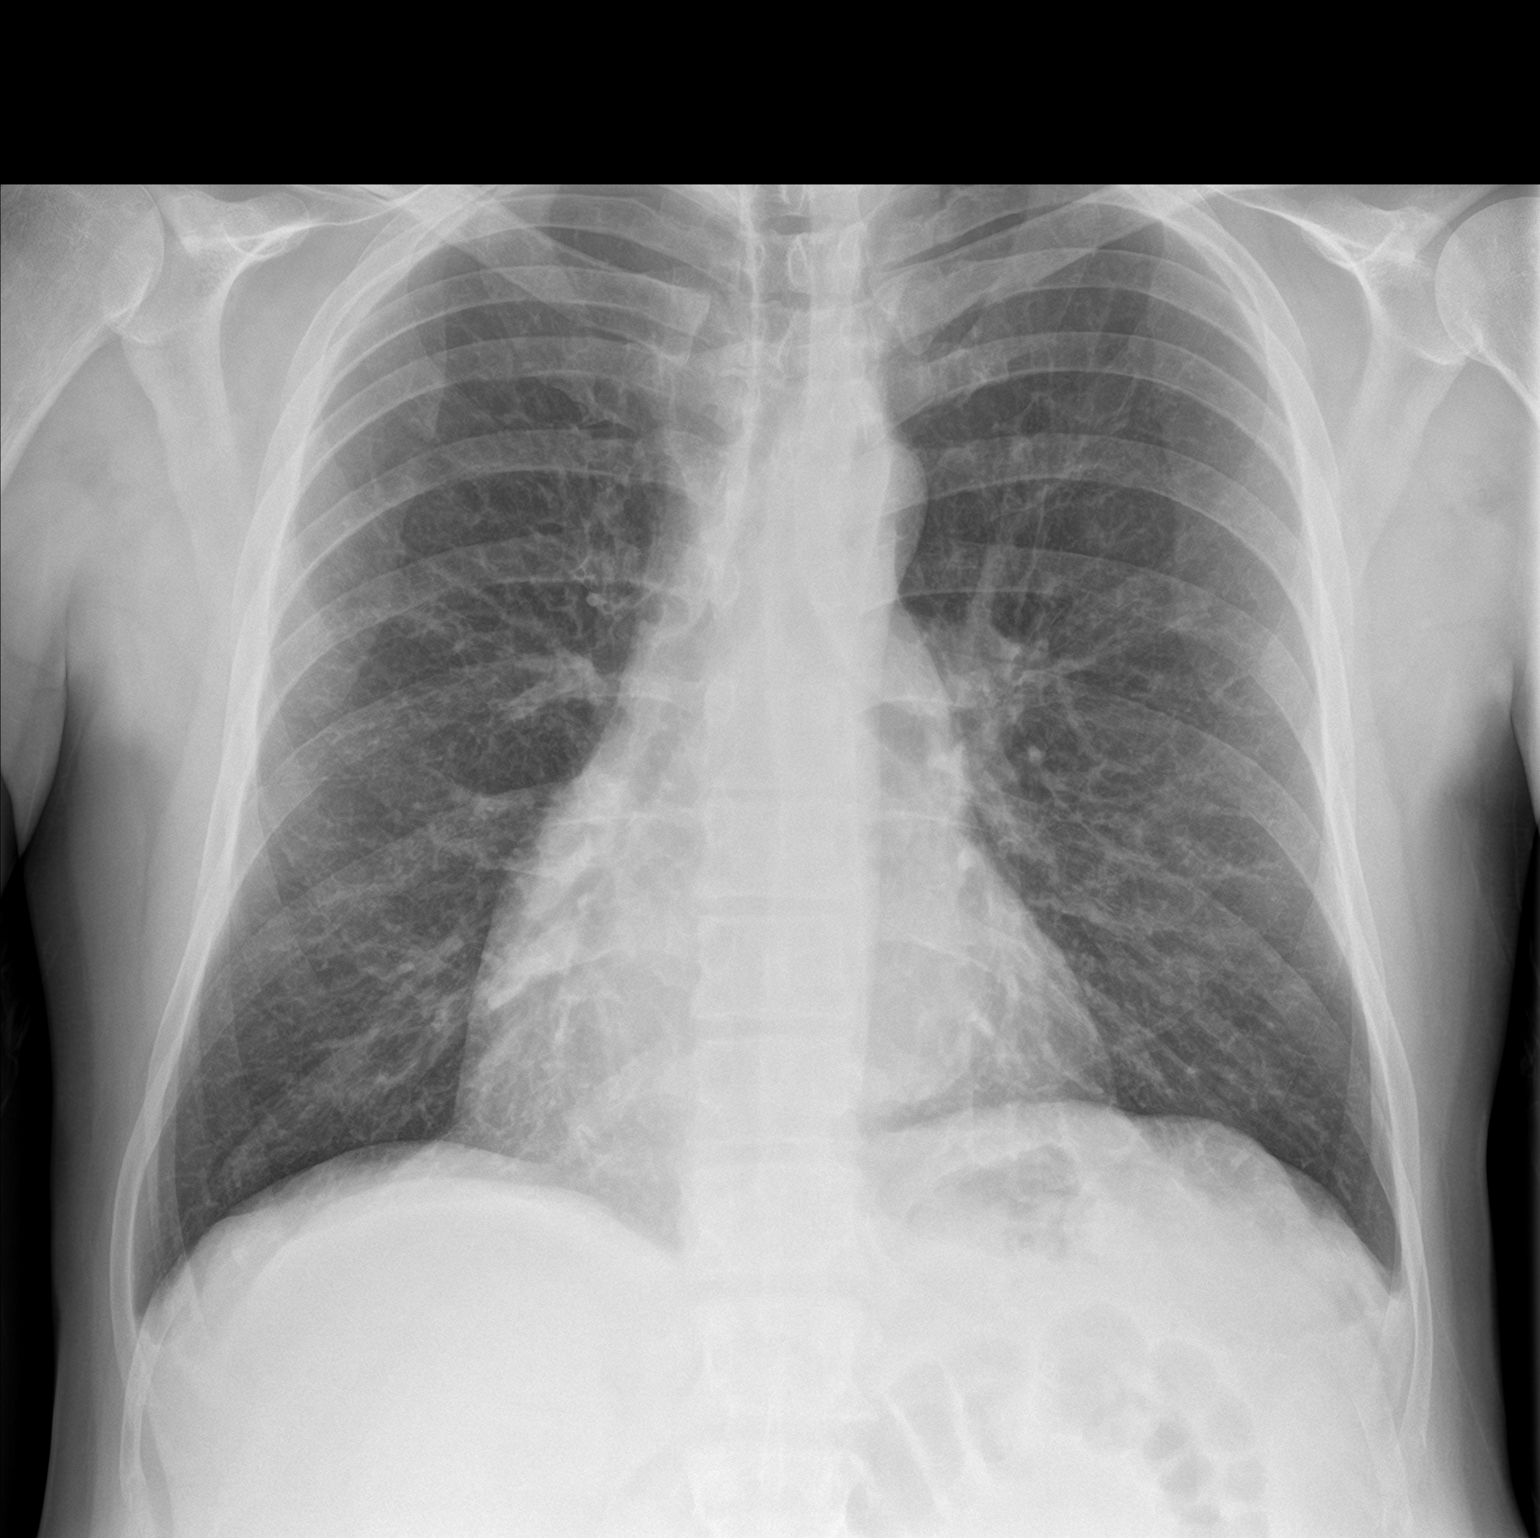

[chest lat]
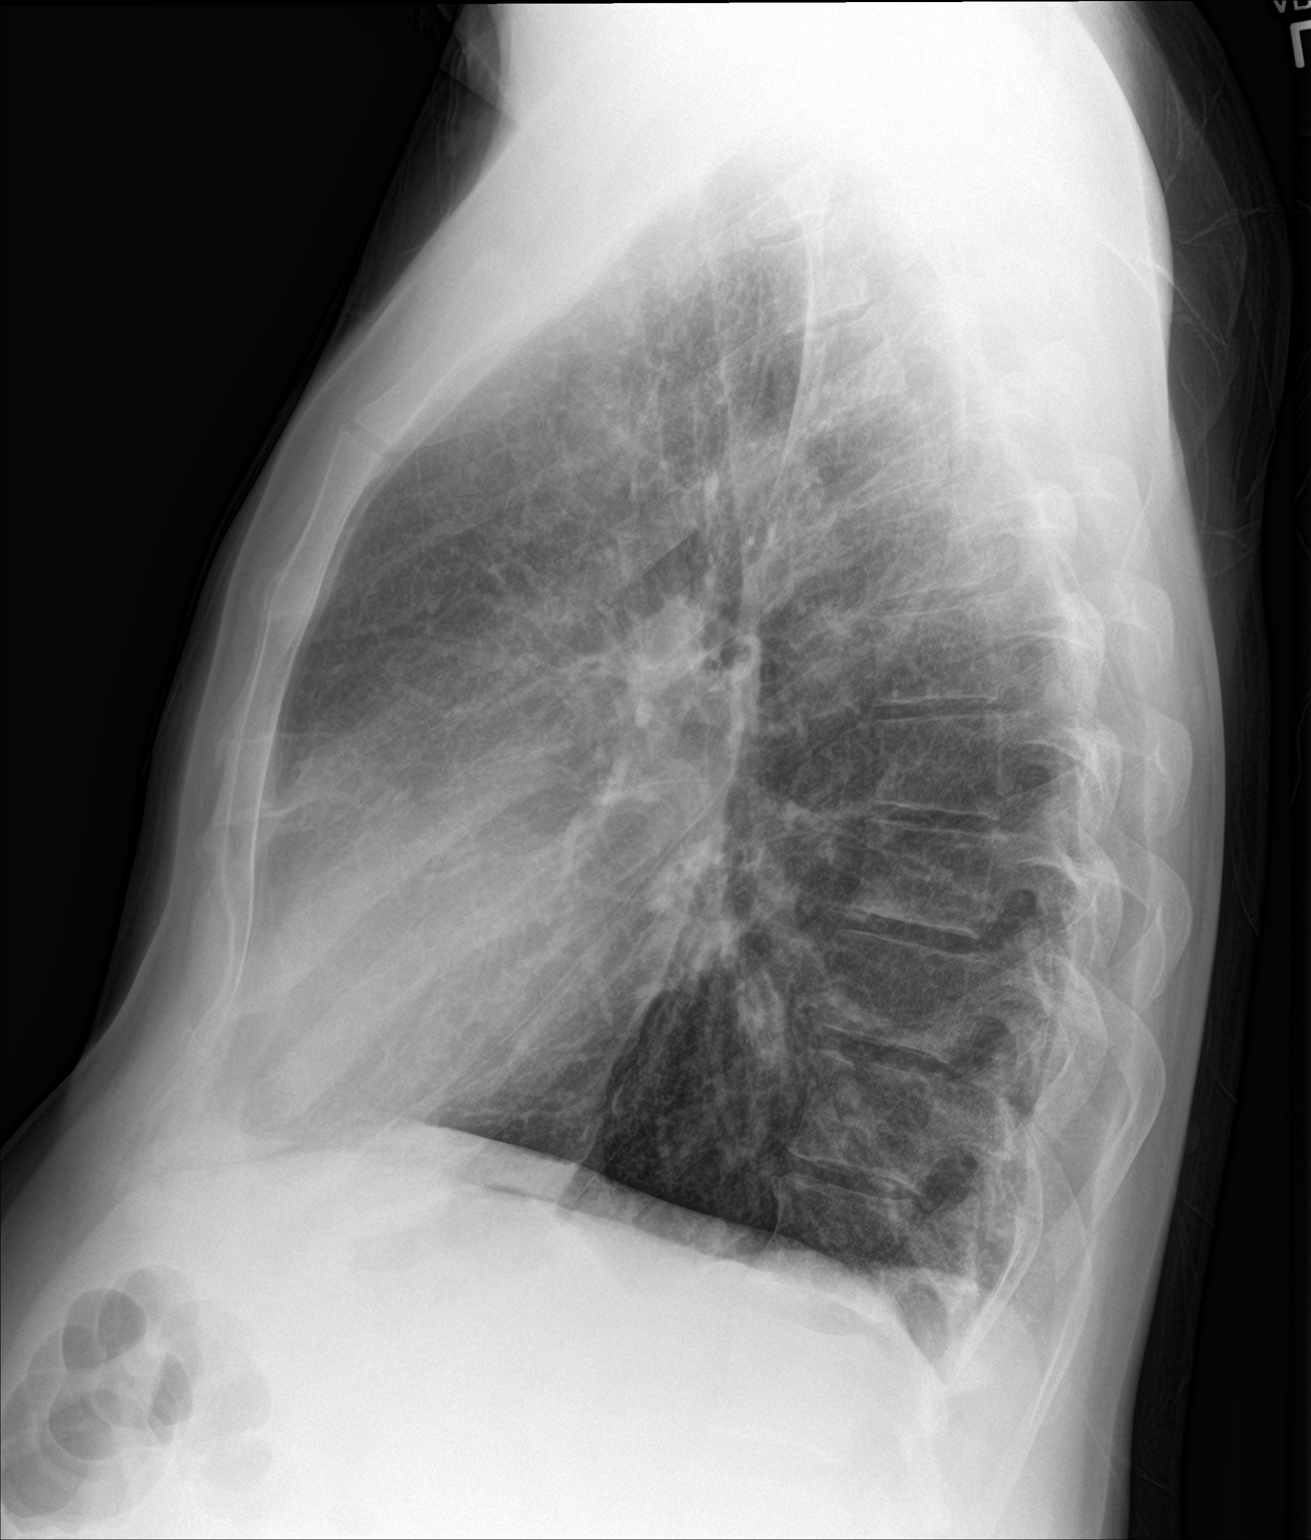

[2 of 2 positions shown; findings below may reference images not displayed]

FINDINGS: Mild hyperinflation. Midline trachea. Borderline cardiomegaly. Mild
left hemidiaphragm elevation. No pleural effusion or pneumothorax.
Biapical pleural thickening. Moderate interstitial thickening. No
lobar consolidation.
IMPRESSION: 1.  No acute cardiopulmonary disease.
2. Mild peribronchial thickening which may relate to chronic
bronchitis or smoking.
